# Patient Record
Sex: Female | Born: 1955 | Race: Asian | Hispanic: No | Smoking: Never smoker
Health system: Southern US, Community
[De-identification: ages and names within clinical notes are randomized; demographics above are authoritative.]

## PROBLEM LIST (undated history)

## (undated) DIAGNOSIS — E785 Hyperlipidemia, unspecified: Secondary | ICD-10-CM

## (undated) DIAGNOSIS — R911 Solitary pulmonary nodule: Secondary | ICD-10-CM

## (undated) DIAGNOSIS — M1990 Unspecified osteoarthritis, unspecified site: Secondary | ICD-10-CM

## (undated) DIAGNOSIS — I10 Essential (primary) hypertension: Secondary | ICD-10-CM

## (undated) DIAGNOSIS — R4189 Other symptoms and signs involving cognitive functions and awareness: Secondary | ICD-10-CM

## (undated) DIAGNOSIS — F329 Major depressive disorder, single episode, unspecified: Secondary | ICD-10-CM

## (undated) DIAGNOSIS — R569 Unspecified convulsions: Secondary | ICD-10-CM

## (undated) DIAGNOSIS — R413 Other amnesia: Secondary | ICD-10-CM

## (undated) DIAGNOSIS — C3490 Malignant neoplasm of unspecified part of unspecified bronchus or lung: Secondary | ICD-10-CM

## (undated) DIAGNOSIS — C50919 Malignant neoplasm of unspecified site of unspecified female breast: Secondary | ICD-10-CM

## (undated) DIAGNOSIS — F32A Depression, unspecified: Secondary | ICD-10-CM

## (undated) HISTORY — DX: Hyperlipidemia, unspecified: E78.5

## (undated) HISTORY — DX: Other symptoms and signs involving cognitive functions and awareness: R41.89

## (undated) HISTORY — DX: Depression, unspecified: F32.A

## (undated) HISTORY — DX: Other amnesia: R41.3

## (undated) HISTORY — PX: OTHER SURGICAL HISTORY: SHX169

## (undated) HISTORY — DX: Solitary pulmonary nodule: R91.1

## (undated) HISTORY — DX: Essential (primary) hypertension: I10

## (undated) HISTORY — DX: Major depressive disorder, single episode, unspecified: F32.9

## (undated) HISTORY — DX: Malignant neoplasm of unspecified site of unspecified female breast: C50.919

## (undated) HISTORY — DX: Unspecified convulsions: R56.9

## (undated) HISTORY — PX: BREAST BIOPSY: SHX20

## (undated) HISTORY — PX: MASTECTOMY: SHX3

## (undated) HISTORY — DX: Malignant neoplasm of unspecified part of unspecified bronchus or lung: C34.90

## (undated) HISTORY — PX: LUNG LOBECTOMY: SHX167

---

## 2004-12-14 ENCOUNTER — Ambulatory Visit: Payer: Self-pay

## 2005-09-03 ENCOUNTER — Ambulatory Visit: Payer: Self-pay

## 2005-09-14 ENCOUNTER — Ambulatory Visit: Payer: Self-pay

## 2006-02-28 ENCOUNTER — Ambulatory Visit: Payer: Self-pay

## 2006-11-23 ENCOUNTER — Emergency Department (HOSPITAL_COMMUNITY): Admission: EM | Admit: 2006-11-23 | Discharge: 2006-11-23 | Payer: Self-pay | Admitting: Emergency Medicine

## 2006-12-07 ENCOUNTER — Ambulatory Visit: Payer: Self-pay

## 2007-02-21 ENCOUNTER — Ambulatory Visit: Payer: Self-pay

## 2008-02-28 ENCOUNTER — Ambulatory Visit: Payer: Self-pay | Admitting: Internal Medicine

## 2009-03-06 ENCOUNTER — Ambulatory Visit: Payer: Self-pay | Admitting: Internal Medicine

## 2009-03-11 ENCOUNTER — Ambulatory Visit: Payer: Self-pay | Admitting: Internal Medicine

## 2009-03-20 ENCOUNTER — Ambulatory Visit: Payer: Self-pay | Admitting: Internal Medicine

## 2009-03-24 ENCOUNTER — Ambulatory Visit: Payer: Self-pay | Admitting: Internal Medicine

## 2010-03-05 ENCOUNTER — Ambulatory Visit: Payer: Self-pay | Admitting: Internal Medicine

## 2010-03-12 ENCOUNTER — Ambulatory Visit: Payer: Self-pay | Admitting: Internal Medicine

## 2011-03-23 ENCOUNTER — Ambulatory Visit: Payer: Self-pay | Admitting: Internal Medicine

## 2011-11-11 ENCOUNTER — Ambulatory Visit: Payer: Self-pay | Admitting: Internal Medicine

## 2012-03-15 ENCOUNTER — Ambulatory Visit: Payer: Self-pay | Admitting: Internal Medicine

## 2012-03-21 ENCOUNTER — Ambulatory Visit: Payer: Self-pay | Admitting: Internal Medicine

## 2012-03-28 ENCOUNTER — Ambulatory Visit: Payer: Self-pay | Admitting: Internal Medicine

## 2012-12-06 LAB — CREATININE, SERUM

## 2013-04-18 ENCOUNTER — Ambulatory Visit: Payer: Self-pay | Admitting: Internal Medicine

## 2013-11-20 ENCOUNTER — Ambulatory Visit: Payer: Self-pay | Admitting: Gastroenterology

## 2013-11-23 LAB — PATHOLOGY REPORT

## 2014-04-11 ENCOUNTER — Ambulatory Visit: Payer: Self-pay | Admitting: Internal Medicine

## 2014-04-18 ENCOUNTER — Ambulatory Visit: Payer: Self-pay | Admitting: Cardiothoracic Surgery

## 2014-04-24 ENCOUNTER — Ambulatory Visit: Payer: Self-pay | Admitting: Cardiothoracic Surgery

## 2014-04-25 LAB — CBC CANCER CENTER
BASOS ABS: 0 x10 3/mm (ref 0.0–0.1)
Basophil %: 0.7 %
EOS PCT: 0.5 %
Eosinophil #: 0 x10 3/mm (ref 0.0–0.7)
HCT: 40.4 % (ref 35.0–47.0)
HGB: 13.4 g/dL (ref 12.0–16.0)
Lymphocyte #: 1.1 x10 3/mm (ref 1.0–3.6)
Lymphocyte %: 31.9 %
MCH: 30.5 pg (ref 26.0–34.0)
MCHC: 33.2 g/dL (ref 32.0–36.0)
MCV: 92 fL (ref 80–100)
MONO ABS: 0.3 x10 3/mm (ref 0.2–0.9)
Monocyte %: 7.8 %
NEUTROS ABS: 2 x10 3/mm (ref 1.4–6.5)
NEUTROS PCT: 59.1 %
Platelet: 201 x10 3/mm (ref 150–440)
RBC: 4.4 10*6/uL (ref 3.80–5.20)
RDW: 13 % (ref 11.5–14.5)
WBC: 3.4 x10 3/mm — ABNORMAL LOW (ref 3.6–11.0)

## 2014-04-25 LAB — BASIC METABOLIC PANEL
ANION GAP: 5 — AB (ref 7–16)
BUN: 18 mg/dL (ref 7–18)
CREATININE: 0.86 mg/dL (ref 0.60–1.30)
Calcium, Total: 9.7 mg/dL (ref 8.5–10.1)
Chloride: 103 mmol/L (ref 98–107)
Co2: 33 mmol/L — ABNORMAL HIGH (ref 21–32)
EGFR (African American): >60
EGFR (Non-African Amer.): >60
GLUCOSE: 117 mg/dL — AB (ref 65–99)
Osmolality: 284 (ref 275–301)
Potassium: 4.4 mmol/L (ref 3.5–5.1)
Sodium: 141 mmol/L (ref 136–145)

## 2014-04-25 LAB — CREATININE, SERUM: Creatine, Serum: 0.61

## 2014-04-25 LAB — APTT: ACTIVATED PTT: 27.3 s (ref 23.6–35.9)

## 2014-04-25 LAB — PROTIME-INR
INR: 1
PROTHROMBIN TIME: 13.3 s (ref 11.5–14.7)

## 2014-04-29 ENCOUNTER — Ambulatory Visit: Payer: Self-pay

## 2014-05-01 ENCOUNTER — Inpatient Hospital Stay: Payer: Self-pay

## 2014-05-02 LAB — BASIC METABOLIC PANEL
ANION GAP: 8 (ref 7–16)
BUN: 15 mg/dL (ref 7–18)
CALCIUM: 7.9 mg/dL — AB (ref 8.5–10.1)
Chloride: 104 mmol/L (ref 98–107)
Co2: 27 mmol/L (ref 21–32)
Creatinine: 0.68 mg/dL (ref 0.60–1.30)
EGFR (African American): >60
Glucose: 123 mg/dL — ABNORMAL HIGH (ref 65–99)
Osmolality: 280 (ref 275–301)
Potassium: 3.8 mmol/L (ref 3.5–5.1)
SODIUM: 139 mmol/L (ref 136–145)

## 2014-05-02 LAB — CBC WITH DIFFERENTIAL/PLATELET
Basophil #: 0 10*3/uL (ref 0.0–0.1)
Basophil %: 0.2 %
Eosinophil #: 0 10*3/uL (ref 0.0–0.7)
Eosinophil %: 0.1 %
HCT: 35.6 % (ref 35.0–47.0)
HGB: 11.7 g/dL — ABNORMAL LOW (ref 12.0–16.0)
Lymphocyte #: 1.4 10*3/uL (ref 1.0–3.6)
Lymphocyte %: 15.1 %
MCH: 30.5 pg (ref 26.0–34.0)
MCHC: 32.8 g/dL (ref 32.0–36.0)
MCV: 93 fL (ref 80–100)
MONO ABS: 1 x10 3/mm — AB (ref 0.2–0.9)
Monocyte %: 11.2 %
NEUTROS ABS: 6.6 10*3/uL — AB (ref 1.4–6.5)
NEUTROS PCT: 73.4 %
RBC: 3.83 10*6/uL (ref 3.80–5.20)
RDW: 13.1 % (ref 11.5–14.5)
WBC: 9 10*3/uL (ref 3.6–11.0)

## 2014-05-03 LAB — COMPREHENSIVE METABOLIC PANEL
ALBUMIN: 2.8 g/dL — AB (ref 3.4–5.0)
ANION GAP: 6 — AB (ref 7–16)
AST: 27 U/L (ref 15–37)
Alkaline Phosphatase: 46 U/L
BUN: 8 mg/dL (ref 7–18)
Bilirubin,Total: 0.6 mg/dL (ref 0.2–1.0)
CREATININE: 0.61 mg/dL (ref 0.60–1.30)
Calcium, Total: 8 mg/dL — ABNORMAL LOW (ref 8.5–10.1)
Chloride: 102 mmol/L (ref 98–107)
Co2: 32 mmol/L (ref 21–32)
EGFR (African American): >60
EGFR (Non-African Amer.): >60
Glucose: 122 mg/dL — ABNORMAL HIGH (ref 65–99)
OSMOLALITY: 279 (ref 275–301)
Potassium: 3.6 mmol/L (ref 3.5–5.1)
SGPT (ALT): 19 U/L
Sodium: 140 mmol/L (ref 136–145)
Total Protein: 6 g/dL — ABNORMAL LOW (ref 6.4–8.2)

## 2014-05-03 LAB — CBC WITH DIFFERENTIAL/PLATELET
Basophil #: 0 10*3/uL (ref 0.0–0.1)
Basophil %: 0.2 %
EOS ABS: 0 10*3/uL (ref 0.0–0.7)
Eosinophil %: 0.2 %
HCT: 35.2 % (ref 35.0–47.0)
HGB: 11.7 g/dL — ABNORMAL LOW (ref 12.0–16.0)
Lymphocyte #: 1.2 10*3/uL (ref 1.0–3.6)
Lymphocyte %: 13.8 %
MCH: 30.4 pg (ref 26.0–34.0)
MCHC: 33.3 g/dL (ref 32.0–36.0)
MCV: 91 fL (ref 80–100)
Monocyte #: 0.9 x10 3/mm (ref 0.2–0.9)
Monocyte %: 10.2 %
NEUTROS PCT: 75.6 %
Neutrophil #: 6.5 10*3/uL (ref 1.4–6.5)
Platelet: 152 10*3/uL (ref 150–440)
RBC: 3.85 10*6/uL (ref 3.80–5.20)
RDW: 13.1 % (ref 11.5–14.5)
WBC: 8.5 10*3/uL (ref 3.6–11.0)

## 2014-05-12 ENCOUNTER — Ambulatory Visit: Payer: Self-pay | Admitting: Cardiothoracic Surgery

## 2014-05-23 ENCOUNTER — Ambulatory Visit: Payer: Self-pay | Admitting: Internal Medicine

## 2014-05-27 DIAGNOSIS — C3411 Malignant neoplasm of upper lobe, right bronchus or lung: Secondary | ICD-10-CM | POA: Insufficient documentation

## 2014-05-31 ENCOUNTER — Ambulatory Visit: Payer: Self-pay | Admitting: Internal Medicine

## 2014-06-13 ENCOUNTER — Ambulatory Visit: Payer: Self-pay | Admitting: Cardiothoracic Surgery

## 2014-07-12 ENCOUNTER — Ambulatory Visit: Payer: Self-pay | Admitting: Cardiothoracic Surgery

## 2014-08-15 ENCOUNTER — Ambulatory Visit: Payer: Self-pay | Admitting: Cardiothoracic Surgery

## 2014-09-10 ENCOUNTER — Ambulatory Visit
Admit: 2014-09-10 | Disposition: A | Discharge status: Home / Self Care | Payer: Self-pay | Attending: Cardiothoracic Surgery | Admitting: Cardiothoracic Surgery

## 2014-10-25 ENCOUNTER — Other Ambulatory Visit: Payer: Self-pay | Admitting: Oncology

## 2014-10-25 DIAGNOSIS — C3490 Malignant neoplasm of unspecified part of unspecified bronchus or lung: Secondary | ICD-10-CM

## 2014-11-02 NOTE — Discharge Summary (Signed)
PATIENT NAME:  Margaret Potts, Margaret Potts MR#:  [id number] DATE OF BIRTH:  [birth year]  DATE OF ADMISSION:  05/01/2014 DATE OF DISCHARGE:  05/08/2014  ADMITTING DIAGNOSIS: Right upper lobe mass.   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   OPERATION PERFORMED: Right upper lobectomy.   HOSPITAL COURSE: [name] is a 59 year old Asian female who had a right upper lobe mass identified on a CT scan. The patient underwent a complete evaluation and was found to have a spiculated mass in the right upper lobe consistent with a primary lung cancer. She was taken to the operating room on the 21st of October where she underwent a preoperative bronchoscopy and right thoracotomy with right upper lobectomy. The intraoperative findings were that of a primary lung malignancy. She was nursed overnight in the intensive care unit and then discharged to the floor where she recovered over the next several days. Her chest tubes were removed on postoperative day #6 and she was discharged to home on postoperative day #7. At the time of discharge she was afebrile. Her oxygen saturations were in the high 90s on room air. Her wounds were healing as expected.   She was given instructions to follow up with Dr. Genevive Bi the next day in clinic for a wound check and for a chest x-ray. She also will follow up with Dr. Grayland Ormond and Dr. Doy Hutching in 2 weeks. She was given a prescription for Norco 5/325 one to two tablets every 4-6 hours as needed as well as losartan 50 mg a day.    ____________________________ Lew Dawes. Genevive Bi, MD teo:TT D: 05/08/2014 13:57:20 ET T: 05/08/2014 21:34:50 ET JOB#: [id number]  cc: Christia Reading E. Genevive Bi, MD, <Dictator> Louis Matte MD ELECTRONICALLY SIGNED 05/22/2014 9:26

## 2014-11-02 NOTE — Consult Note (Signed)
Brief Consult Note: Diagnosis: medical management.   Patient was seen by consultant.   Consult note dictated.   Orders entered.   Comments: will be happy to keep managing medical issues.  Electronic Signatures: Vaughan Basta (MD)  (Signed 21-Oct-15 17:23)  Authored: Brief Consult Note   Last Updated: 21-Oct-15 17:23 by Vaughan Basta (MD)

## 2014-11-02 NOTE — Consult Note (Signed)
PATIENT NAME:  Margaret Potts, Margaret Potts MR#:  [id number] DATE OF BIRTH:  [birth year]  DATE OF CONSULTATION:  05/01/2014  REFERRING PHYSICIAN:   CONSULTING PHYSICIAN:  Ceasar Lund. Anselm Jungling, MD  REFERRING PHYSICIAN: Dr. Nestor Lewandowsky.    REASON FOR CONSULTATION: Medical management postoperatively.   PRIMARY CARE PHYSICIAN: Dr. Fulton Reek.   HISTORY OF PRESENT ILLNESS: This is a 59 year old female who was recently diagnosed with right upper lobe nodule in the lung which was increasing in size and was worrisome for primary bronchogenic carcinoma. A PET scan was done on October 14 which showed hypermetabolic right upper lobe pulmonary nodule with no evidence of mediastinal node metastasis or distant metastasis. She was referred to oncology clinic and they plan to have resection of the primary tumor as there was no metastasis noted.  Dr. Nestor Lewandowsky took her for the surgery today and did right upper lobe resection and postsurgically he called the medical team to manage her medical issues if there is anything that arises. On further questioning the patient denies any complaint of pain, cough or nausea, vomiting, or shortness of breath right now and resting comfortably.   REVIEW OF SYSTEMS: CONSTITUTIONAL: Negative for fever, fatigue, weakness, pain, or weight loss.  EYES: No blurring or double vision, discharge, or redness.  EARS, NOSE, THROAT: No tinnitus, ear pain, or hearing loss.  RESPIRATORY: No cough, wheezing, hemoptysis, or shortness of breath.  CARDIOVASCULAR: No chest pain, orthopnea, edema, arrhythmia, palpitation.  GASTROINTESTINAL: No nausea, vomiting, diarrhea, abdominal pain.  GENITOURINARY: No dysuria, hematuria, increased frequency.  ENDOCRINE: No heat or cold intolerance. No excessive sweating.  SKIN: No acne, rashes, or lesions.  MUSCULOSKELETAL: No pain or swelling in the joints.  NEUROLOGICAL: No numbness, weakness, tremor, or vertigo.  PSYCHIATRIC: No anxiety, insomnia, bipolar  disorder.   PAST MEDICAL HISTORY: The patient had remote history of hypertension and hyperlipidemia and right-sided mastectomy for breast cancer in 1995, but she says she is following with Dr. Doy Hutching regularly and she was on medication in the past for blood pressure and hyperlipidemia, but was taken off as it was remaining normal and on regular checkups it stayed normal.   SOCIAL HISTORY: She denies smoking, alcohol, or illegal drug use and working in post office.   FAMILY HISTORY: Mother with lung and liver cancer, nonsmoker, died in her 16s. Brother with lung and liver cancer and positive smoker. Sister with lung and liver cancer, nonsmoker and recently died on and paternal aunt with breast cancer at early age. Her last colonoscopy was May 2015.   MEDICATIONS:  1.  Vitamin D3 5000 international units once a day.  2.  Vitamin C 500 mg 2 tablets once a day.  3.  Magnesium oxide 400 mg once a day.  4.  Folic acid 1 mg once a day.   5.  Collagen once a day.  6.  Centrum Silver once a day.   PHYSICAL EXAMINATION:   VITAL SIGNS: Currently temperature 97.9, pulse is 66, respirations 12, blood pressure 104/57, and pulse oximetry is 100 on 2 liters oxygen supplementation.  GENERAL: The patient is fully alert and oriented to time, place, and person. Does not appear in acute distress.  HEENT: Head and neck atraumatic. Conjunctivae pink. Oral mucosa moist.  NECK: Supple. No JVD.  RESPIRATORY: Bilateral air entry present. There is dressing on the right side. No crepitation or wheezing.  CARDIOVASCULAR: S1, S2 present, regular. No murmur.  ABDOMEN: Soft, nontender. Bowel sounds present. No organomegaly.  SKIN: No rashes.  LEGS: No edema.  NEUROLOGICAL: Power 5 out of 5. No tremor or rigidity. Follows commands.  JOINTS: No swelling or tenderness.  PSYCHIATRIC: Does not appear in acute psychiatric illness at this time.   IMPORTANT LABORATORY RESULTS:   1.  Glucose 128, BUN 18, creatinine 0.86,  sodium 141, potassium 4.4, chloride 103, CO2 of 33, calcium is 9.7.  2.  WBC 3.4, hemoglobin 13.4, platelet count 201,000,  MCV is 92.   3.  Chest CT and PET scan as mentioned above.   ASSESSMENT AND PLAN: A 60 year old female who is currently postoperative for right upper lobe lung nodule resection and medical consult is called in for general medical management.   1.  Lung nodule status post resection. Further management as per the oncological team, currently no intervention is required, wait for the pathological report.  2.  Pain.  Post surgically there would be pain and we will manage it with Tylenol and morphine as needed.  3.  Hyperglycemia. Blood sugar level is 128, but that does not appear to be. she will just continue monitoring and may not require anything for long term.  4.  Code status is full code.   TOTAL TIME SPENT ON THIS CONSULT: 45 minutes.    We are thankful for letting us participate in the care of this patient. We will continue following for any medical issues.      ____________________________ Ceasar Lund Anselm Jungling, MD vgv:bu D: 05/01/2014 17:35:24 ET T: 05/01/2014 20:53:16 ET JOB#: [id number]  cc: Ceasar Lund. Anselm Jungling, MD, <Dictator> Vaughan Basta MD ELECTRONICALLY SIGNED 05/22/2014 22:13

## 2014-11-02 NOTE — Op Note (Signed)
PATIENT NAME:  Margaret Potts, Margaret Potts MR#:  [id number] DATE OF BIRTH:  August 30, 1955  DATE OF PROCEDURE:  05/01/2014  SURGEON:  Christia Reading E. Genevive Bi, MD   ASSISTANT:  Dr. Bronson Ing  PREOPERATIVE DIAGNOSIS:  Right upper lobe mass.   POSTOPERATIVE DIAGNOSIS:  Right upper lobe mass.  OPERATION PERFORMED: 1.  Preoperative bronchoscopy to assess endobronchial anatomy.  2.  Right muscle-sparing thoracotomy with right upper lobectomy. Frozen section consistent with adenocarcinoma.   INDICATIONS FOR PROCEDURE:  Margaret Potts is a 59 year old Asian female with a right upper lobe mass identified on chest CT and on PET. It was highly suspicious for malignancy. In addition, she has previously undergone a right mastectomy many years ago for breast cancer, but this had more of the appearance of lung cancer, and because of that, she was offered the above-named operation. The indications and risks were explained to the patient, who gave her informed consent.   DESCRIPTION OF PROCEDURE:  The patient was brought to the operating suite and placed in the supine position. General endotracheal anesthesia was given with a double-lumen tube. Preoperative bronchoscopy was carried out. This was normal to the subsegmental levels bilaterally. There was no evidence of endobronchial tumor. The patient was then turned for a right thoracotomy. All pressure points were carefully padded. The patient was prepped and draped in the usual sterile fashion. A muscle-sparing incision was made, and the fifth intercostal space was entered. The latissimus and serratus muscles were retracted anteriorly and posteriorly, and a small rib retractor was placed. Through this incision, we then visualized the diaphragm and created an access port on the inferior aspect of the right chest. This was for camera and for other stapling devices. The fissures were near complete, and we began by opening the pleura overlying the hilum of the lung. The superior pulmonary vein was  identified, and the branches of the middle lobe and upper lobe were identified as well. The fissure was opened, and the fissure between the upper and the middle lobe was created using a stapler. The bronchus was identified posteriorly, and further dissection within the fissure revealed multiple branches to the upper lobes. There was a large posterior ascending branch and then there were 2 other smaller branches as well as the apical anterior branch; all of these went to the upper lobe. We divided these using the vascular stapler. The superior pulmonary vein draining the upper lobe was also divided with a vascular stapler. The middle lobe veins and arteries were spared, of course. The only remaining structure was the bronchus, and using a TX stapler, we occluded the bronchus. The middle and lower lobes ventilated nicely, and then the stapler was fired, and the specimen was transected and passed off the field. The bronchial stump was checked at 30 cm of water pressure, and there was no air leak. The chest was copiously irrigated, and 2 chest tubes were inserted. An angled and a straight tube were placed through their normal positions. The previous thoracoscopy port site was closed with interrupted Vicryls and nylon on the skin. The middle lobe was then stapled to the lower lobe to prevent torsion, as it was a near complete fissure. The inferior pulmonary ligament was likewise divided up to the level of the inferior pulmonary vein to allow mobilization of the entire lung. At this point, hemostasis was complete, and we then placed our paracostal sutures of #2 Vicryl. The ribs were brought back into place, and the muscles of the chest wall were allowed to return  to their normal anatomic position. A 15 mm Blake drain was placed in the subcutaneous tissues, and the subcutaneous tissues were closed with a 2-0 Vicryl. The skin was closed with staples. Sterile dressings were applied, and then the patient was rolled in the  supine position, where she was extubated and taken to the recovery room in stable condition.    ____________________________ Lew Dawes Genevive Bi, MD teo:nb D: 05/01/2014 15:18:33 ET T: 05/02/2014 06:22:36 ET JOB#: [id number]  cc: Lew Dawes. Genevive Bi, MD, <Dictator> Leonie Douglas. Doy Hutching, MD Kathlene November. Grayland Ormond, [place] MD ELECTRONICALLY SIGNED 05/22/2014 9:26

## 2014-11-04 LAB — SURGICAL PATHOLOGY

## 2014-11-06 DIAGNOSIS — R413 Other amnesia: Secondary | ICD-10-CM | POA: Insufficient documentation

## 2014-11-06 DIAGNOSIS — R911 Solitary pulmonary nodule: Secondary | ICD-10-CM | POA: Insufficient documentation

## 2014-11-06 DIAGNOSIS — R4189 Other symptoms and signs involving cognitive functions and awareness: Secondary | ICD-10-CM | POA: Insufficient documentation

## 2014-11-06 DIAGNOSIS — E785 Hyperlipidemia, unspecified: Secondary | ICD-10-CM | POA: Insufficient documentation

## 2014-11-14 ENCOUNTER — Ambulatory Visit: Payer: Self-pay | Admitting: Oncology

## 2014-11-14 ENCOUNTER — Ambulatory Visit: Admission: RE | Admit: 2014-11-14 | Payer: Federal, State, Local not specified - PPO | Source: Ambulatory Visit

## 2014-11-26 ENCOUNTER — Other Ambulatory Visit: Payer: Self-pay | Admitting: Oncology

## 2014-11-26 DIAGNOSIS — R911 Solitary pulmonary nodule: Secondary | ICD-10-CM

## 2014-11-27 ENCOUNTER — Other Ambulatory Visit: Payer: Self-pay | Admitting: Oncology

## 2014-11-27 ENCOUNTER — Ambulatory Visit
Admission: RE | Admit: 2014-11-27 | Discharge: 2014-11-27 | Disposition: A | Discharge status: Home / Self Care | Payer: Federal, State, Local not specified - PPO | Source: Ambulatory Visit | Attending: Oncology | Admitting: Oncology

## 2014-11-27 ENCOUNTER — Ambulatory Visit: Payer: Federal, State, Local not specified - PPO | Admitting: Oncology

## 2014-11-27 DIAGNOSIS — R911 Solitary pulmonary nodule: Secondary | ICD-10-CM

## 2014-11-27 DIAGNOSIS — Z9011 Acquired absence of right breast and nipple: Secondary | ICD-10-CM | POA: Diagnosis not present

## 2014-11-27 DIAGNOSIS — I517 Cardiomegaly: Secondary | ICD-10-CM | POA: Diagnosis not present

## 2014-11-27 DIAGNOSIS — C3490 Malignant neoplasm of unspecified part of unspecified bronchus or lung: Secondary | ICD-10-CM

## 2014-11-27 MED ORDER — IOHEXOL 300 MG/ML  SOLN
75.0000 mL | Freq: Once | INTRAMUSCULAR | Status: AC | PRN
  Administered 2014-11-27: 75 mL via INTRAVENOUS

## 2014-12-02 ENCOUNTER — Inpatient Hospital Stay: Payer: Federal, State, Local not specified - PPO | Attending: Oncology | Admitting: Oncology

## 2014-12-02 ENCOUNTER — Encounter: Payer: Self-pay | Admitting: Oncology

## 2014-12-02 ENCOUNTER — Encounter (INDEPENDENT_AMBULATORY_CARE_PROVIDER_SITE_OTHER): Payer: Self-pay

## 2014-12-02 VITALS — BP 131/86 | HR 59 | Temp 98.2°F | Ht 60.0 in | Wt 114.3 lb

## 2014-12-02 DIAGNOSIS — Z79899 Other long term (current) drug therapy: Secondary | ICD-10-CM | POA: Diagnosis not present

## 2014-12-02 DIAGNOSIS — Z9011 Acquired absence of right breast and nipple: Secondary | ICD-10-CM | POA: Diagnosis not present

## 2014-12-02 DIAGNOSIS — Z902 Acquired absence of lung [part of]: Secondary | ICD-10-CM | POA: Insufficient documentation

## 2014-12-02 DIAGNOSIS — I10 Essential (primary) hypertension: Secondary | ICD-10-CM | POA: Insufficient documentation

## 2014-12-02 DIAGNOSIS — Z853 Personal history of malignant neoplasm of breast: Secondary | ICD-10-CM | POA: Insufficient documentation

## 2014-12-02 DIAGNOSIS — C3411 Malignant neoplasm of upper lobe, right bronchus or lung: Secondary | ICD-10-CM | POA: Insufficient documentation

## 2014-12-02 DIAGNOSIS — Z9221 Personal history of antineoplastic chemotherapy: Secondary | ICD-10-CM | POA: Diagnosis not present

## 2014-12-19 NOTE — Progress Notes (Signed)
[place]  Telephone:([phone] Fax:(336) [phone]  ID: Margaret Potts OB: [birth year]  MR#: [id number]  BTD#:[id number]  Patient Care Team: Idelle Crouch, MD as PCP - General (Internal Medicine)  CHIEF COMPLAINT:  Chief Complaint  Patient presents with  . Follow-up    CT scan for hx of breast CA and lung CA    INTERVAL HISTORY: Patient returns to clinic today for further evaluation and discussion of her imaging results. She continues to feel well and is remains asymptomatic. She has no neurologic complaints. She denies any recent fevers or illnesses. She has a good appetite and denies weight loss. She has no chest pain, shortness breath, cough, or hemoptysis. She denies any nausea, vomiting, constipation, or diarrhea. Has no urinary complaints. Patient offers no specific complaints today.  REVIEW OF SYSTEMS:   Review of Systems  Constitutional: Negative.   Respiratory: Negative.   Cardiovascular: Negative.     As per HPI. Otherwise, a complete review of systems is negatve.  PAST MEDICAL HISTORY: Past Medical History  Diagnosis Date  . Hyperlipemia   . Hypertension   . Cognitive deficits   . Memory loss   . Seizures   . Depression   . Lung nodule   . Breast cancer     RT with mastectomy in 1995  . Lung cancer     RT upper lobe removed in OCT 2015    PAST SURGICAL HISTORY: Past Surgical History  Procedure Laterality Date  . Right mastectomy      FAMILY HISTORY: Mother with lung/liver cancer nonsmoker died in her 41s. Brother with lung/liver cancer, positive smoker. Sister, lung/liver cancer nonsmoker recently deceased. Paternal aunt with breast cancer at an "early age"     ADVANCED DIRECTIVES:    HEALTH MAINTENANCE: History  Substance Use Topics  . Smoking status: Never Smoker   . Smokeless tobacco: Never Used  . Alcohol Use: Yes     Comment: occassionally     Colonoscopy:  PAP:  Bone density:  Lipid panel:  No Known  Allergies  No current outpatient prescriptions on file.   No current facility-administered medications for this visit.    OBJECTIVE: Filed Vitals:   12/02/14 1547  BP: 131/86  Pulse: 59  Temp: 98.2 F (36.8 C)     Body mass index is 22.32 kg/(m^2).    ECOG FS:0 - Asymptomatic  General: Well-developed, well-nourished, no acute distress. Eyes: anicteric sclera. Lungs: Clear to auscultation bilaterally. Heart: Regular rate and rhythm. No rubs, murmurs, or gallops. Abdomen: Soft, nontender, nondistended. No organomegaly noted, normoactive bowel sounds. Musculoskeletal: No edema, cyanosis, or clubbing. Neuro: Alert, answering all questions appropriately. Cranial nerves grossly intact. Skin: No rashes or petechiae noted. Psych: Normal affect.   LAB RESULTS:  Lab Results  Component Value Date   NA 140 05/03/2014   K 3.6 05/03/2014   CL 102 05/03/2014   CO2 32 05/03/2014   GLUCOSE 122* 05/03/2014   BUN 8 05/03/2014   CREATININE 0.61 05/03/2014   CALCIUM 8.0* 05/03/2014   PROT 6.0* 05/03/2014   ALBUMIN 2.8* 05/03/2014   AST 27 05/03/2014   ALT 19 05/03/2014   ALKPHOS 46 05/03/2014    Lab Results  Component Value Date   WBC 8.5 05/03/2014   NEUTROABS 6.5 05/03/2014   HGB 11.7* 05/03/2014   HCT 35.2 05/03/2014   MCV 91 05/03/2014   PLT 152 05/03/2014     STUDIES: Ct Chest W Contrast  11/27/2014   CLINICAL DATA:  Right  thoracic pain since prior right upper lobectomy in October 2015.  EXAM: CT CHEST WITH CONTRAST  TECHNIQUE: Multidetector CT imaging of the chest was performed during intravenous contrast administration.  CONTRAST:  28m OMNIPAQUE IOHEXOL 300 MG/ML  SOLN  COMPARISON:  05/31/2014  FINDINGS: Mediastinum/Nodes: Ascending aortic caliber is 3.6 cm. No pathologic adenopathy. Mild cardiomegaly.  Lungs/Pleura: Right mastectomy. Right upper lobectomy. Slightly reduced scarring superiorly along the right middle lobe. No pleural effusion or recurrent mass identified.  Mild scarring in the left lower lobe along the hemidiaphragm.  Upper abdomen: Unremarkable  Musculoskeletal: No significant rib lesion.  IMPRESSION: 1. A specific cause for the patient's right chest pain is not identified. The patient has had prior right mastectomy and right upper lobectomy. Expected postoperative findings noted. No recurrent mass. 2. Mild cardiomegaly. 3. Ascending aortic diameter 3.6 cm. Recommend annual imaging followup by CTA or MRA. This recommendation follows 2010 ACCF/AHA/AATS/ACR/ASA/SCA/SCAI/SIR/STS/SVM Guidelines for the Diagnosis and Management of Patients with Thoracic Aortic Disease. Circulation.2010; 121:: M767-M094  Electronically Signed   By: WVan ClinesM.D.   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  PLAN:    1. Lung cancer: Given patient's stage of disease she does not require any further adjuvant chemotherapy. Because she is a nonsmoker, can consider sending pathology for EGFR testing for future use if necessary. No further intervention is needed at this time. Return to clinic in 6 months with restaging CT scan and further evaluation. Will repeat imaging every 6 months for 2 years after her surgery and then yearly after that.  2. History of breast cancer: Patient had breast cancer in 1995. Genetic testing did not reveal any significant abnormalities. No further intervention is needed at this time.   Patient expressed understanding and was in agreement with this plan. She also understands that She can call clinic at any time with any questions, concerns, or complaints.   No matching staging information was found for the patient.  TLloyd Huger MD   12/19/2014 8:52 AM

## 2015-03-05 ENCOUNTER — Other Ambulatory Visit: Payer: Self-pay | Admitting: Internal Medicine

## 2015-03-05 DIAGNOSIS — R102 Pelvic and perineal pain: Secondary | ICD-10-CM

## 2015-03-05 DIAGNOSIS — N644 Mastodynia: Secondary | ICD-10-CM

## 2015-03-05 DIAGNOSIS — R079 Chest pain, unspecified: Secondary | ICD-10-CM

## 2015-03-05 DIAGNOSIS — C3411 Malignant neoplasm of upper lobe, right bronchus or lung: Secondary | ICD-10-CM

## 2015-03-05 DIAGNOSIS — R1084 Generalized abdominal pain: Secondary | ICD-10-CM

## 2015-03-11 ENCOUNTER — Ambulatory Visit
Admission: RE | Admit: 2015-03-11 | Discharge: 2015-03-11 | Disposition: A | Discharge status: Home / Self Care | Payer: Federal, State, Local not specified - PPO | Source: Ambulatory Visit | Attending: Internal Medicine | Admitting: Internal Medicine

## 2015-03-11 ENCOUNTER — Other Ambulatory Visit: Payer: Self-pay | Admitting: Internal Medicine

## 2015-03-11 DIAGNOSIS — D644 Congenital dyserythropoietic anemia: Secondary | ICD-10-CM | POA: Diagnosis present

## 2015-03-11 DIAGNOSIS — Z853 Personal history of malignant neoplasm of breast: Secondary | ICD-10-CM | POA: Insufficient documentation

## 2015-03-11 DIAGNOSIS — N644 Mastodynia: Secondary | ICD-10-CM

## 2015-03-12 ENCOUNTER — Ambulatory Visit
Admission: RE | Admit: 2015-03-12 | Discharge: 2015-03-12 | Disposition: A | Discharge status: Home / Self Care | Payer: Federal, State, Local not specified - PPO | Source: Ambulatory Visit | Attending: Internal Medicine | Admitting: Internal Medicine

## 2015-03-12 DIAGNOSIS — Z9011 Acquired absence of right breast and nipple: Secondary | ICD-10-CM | POA: Insufficient documentation

## 2015-03-12 DIAGNOSIS — Z85118 Personal history of other malignant neoplasm of bronchus and lung: Secondary | ICD-10-CM | POA: Diagnosis present

## 2015-03-12 DIAGNOSIS — R102 Pelvic and perineal pain: Secondary | ICD-10-CM

## 2015-03-12 DIAGNOSIS — J9811 Atelectasis: Secondary | ICD-10-CM | POA: Insufficient documentation

## 2015-03-12 DIAGNOSIS — R079 Chest pain, unspecified: Secondary | ICD-10-CM | POA: Diagnosis present

## 2015-03-12 DIAGNOSIS — C3411 Malignant neoplasm of upper lobe, right bronchus or lung: Secondary | ICD-10-CM

## 2015-03-12 DIAGNOSIS — R1084 Generalized abdominal pain: Secondary | ICD-10-CM

## 2015-03-12 MED ORDER — IOHEXOL 300 MG/ML  SOLN
100.0000 mL | Freq: Once | INTRAMUSCULAR | Status: AC | PRN
  Administered 2015-03-12: 80 mL via INTRAVENOUS

## 2015-06-09 ENCOUNTER — Ambulatory Visit
Admission: RE | Admit: 2015-06-09 | Discharge: 2015-06-09 | Disposition: A | Discharge status: Home / Self Care | Payer: Federal, State, Local not specified - PPO | Source: Ambulatory Visit | Attending: Oncology | Admitting: Oncology

## 2015-06-09 DIAGNOSIS — Z902 Acquired absence of lung [part of]: Secondary | ICD-10-CM | POA: Diagnosis not present

## 2015-06-09 DIAGNOSIS — C3411 Malignant neoplasm of upper lobe, right bronchus or lung: Secondary | ICD-10-CM | POA: Insufficient documentation

## 2015-06-09 LAB — POCT I-STAT CREATININE: CREATININE: 0.9 mg/dL (ref 0.44–1.00)

## 2015-06-09 MED ORDER — IOHEXOL 350 MG/ML SOLN
100.0000 mL | Freq: Once | INTRAVENOUS | Status: AC | PRN
  Administered 2015-06-09: 65 mL via INTRAVENOUS

## 2015-06-11 ENCOUNTER — Inpatient Hospital Stay: Payer: Federal, State, Local not specified - PPO | Attending: Oncology | Admitting: Oncology

## 2015-06-11 VITALS — BP 135/84 | HR 58 | Temp 98.5°F | Resp 16 | Wt 114.2 lb

## 2015-06-11 DIAGNOSIS — C3411 Malignant neoplasm of upper lobe, right bronchus or lung: Secondary | ICD-10-CM | POA: Insufficient documentation

## 2015-06-11 DIAGNOSIS — I10 Essential (primary) hypertension: Secondary | ICD-10-CM | POA: Insufficient documentation

## 2015-06-11 DIAGNOSIS — Z902 Acquired absence of lung [part of]: Secondary | ICD-10-CM | POA: Diagnosis not present

## 2015-06-11 DIAGNOSIS — F329 Major depressive disorder, single episode, unspecified: Secondary | ICD-10-CM | POA: Insufficient documentation

## 2015-06-11 DIAGNOSIS — Z9011 Acquired absence of right breast and nipple: Secondary | ICD-10-CM | POA: Diagnosis not present

## 2015-06-11 DIAGNOSIS — Z853 Personal history of malignant neoplasm of breast: Secondary | ICD-10-CM | POA: Diagnosis not present

## 2015-06-11 DIAGNOSIS — E785 Hyperlipidemia, unspecified: Secondary | ICD-10-CM | POA: Diagnosis not present

## 2015-06-11 DIAGNOSIS — C3401 Malignant neoplasm of right main bronchus: Secondary | ICD-10-CM

## 2015-06-11 DIAGNOSIS — Z9889 Other specified postprocedural states: Secondary | ICD-10-CM | POA: Insufficient documentation

## 2015-06-11 NOTE — Progress Notes (Signed)
Patient had a cold 3 weeks ago and has had a dry cough since.

## 2015-06-20 NOTE — Progress Notes (Signed)
[place]  [phone] Fax:(336) [phone]  ID: Farrel Gobble OB: [birth year]  MR#: [id number]  TDV#:[id number]  Patient Care Team: Idelle Crouch, MD as PCP - General (Internal Medicine)  CHIEF COMPLAINT:  Chief Complaint  Patient presents with  . Lung Cancer    INTERVAL HISTORY: Patient returns to clinic today for further evaluation and discussion of her imaging results. Patient had increased cough and congestion approximately 3 months ago, but this has resolved. She currently feels well and is asymptomatic. She has no neurologic complaints. She any recent fevers. She has a good appetite and denies weight loss. She has no chest pain, shortness breath, or hemoptysis. She denies any nausea, vomiting, constipation, or diarrhea. Has no urinary complaints. Patient offers no further specific complaints today.  REVIEW OF SYSTEMS:   Review of Systems  Constitutional: Negative.  Negative for fever, weight loss and malaise/fatigue.  Respiratory: Negative.   Cardiovascular: Negative.   Gastrointestinal: Negative.   Musculoskeletal: Negative.   Neurological: Negative.  Negative for weakness.    As per HPI. Otherwise, a complete review of systems is negatve.  PAST MEDICAL HISTORY: Past Medical History  Diagnosis Date  . Hyperlipemia   . Hypertension   . Cognitive deficits   . Memory loss   . Seizures ([place])   . Depression   . Lung nodule   . Lung cancer ([place])     RT upper lobe removed in OCT 2015  . Breast cancer ([place])     RT with mastectomy in 1995    PAST SURGICAL HISTORY: Past Surgical History  Procedure Laterality Date  . Right mastectomy    . Mastectomy Right   . Breast biopsy Left     benign    FAMILY HISTORY: Mother with lung/liver cancer nonsmoker died in her 44s. Brother with lung/liver cancer, positive smoker. Sister, lung/liver cancer nonsmoker recently deceased. Paternal aunt with breast cancer at an "early age"     ADVANCED  DIRECTIVES:    HEALTH MAINTENANCE: Social History  Substance Use Topics  . Smoking status: Never Smoker   . Smokeless tobacco: Never Used  . Alcohol Use: Yes     Comment: occassionally     Colonoscopy:  PAP:  Bone density:  Lipid panel:  No Known Allergies  No current outpatient prescriptions on file.   No current facility-administered medications for this visit.    OBJECTIVE: Filed Vitals:   06/11/15 1520  BP: 135/84  Pulse: 58  Temp: 98.5 F (36.9 C)  Resp: 16     Body mass index is 22.3 kg/(m^2).    ECOG FS:0 - Asymptomatic  General: Well-developed, well-nourished, no acute distress. Eyes: anicteric sclera. Lungs: Clear to auscultation bilaterally. Heart: Regular rate and rhythm. No rubs, murmurs, or gallops. Abdomen: Soft, nontender, nondistended. No organomegaly noted, normoactive bowel sounds. Musculoskeletal: No edema, cyanosis, or clubbing. Neuro: Alert, answering all questions appropriately. Cranial nerves grossly intact. Skin: No rashes or petechiae noted. Psych: Normal affect.   LAB RESULTS:  Lab Results  Component Value Date   NA 140 05/03/2014   K 3.6 05/03/2014   CL 102 05/03/2014   CO2 32 05/03/2014   GLUCOSE 122* 05/03/2014   BUN 8 05/03/2014   CREATININE 0.90 06/09/2015   CALCIUM 8.0* 05/03/2014   PROT 6.0* 05/03/2014   ALBUMIN 2.8* 05/03/2014   AST 27 05/03/2014   ALT 19 05/03/2014   ALKPHOS 46 05/03/2014   BILITOT 0.6 05/03/2014   GFRNONAA >60 05/03/2014   GFRAA >60 05/03/2014  Lab Results  Component Value Date   WBC 8.5 05/03/2014   NEUTROABS 6.5 05/03/2014   HGB 11.7* 05/03/2014   HCT 35.2 05/03/2014   MCV 91 05/03/2014   PLT 152 05/03/2014     STUDIES: Ct Chest W Contrast  06/09/2015  CLINICAL DATA:  Status post right upper lobectomy for right upper lobe lung cancer diagnosed in October 2015, presenting for restaging. History of right mastectomy for right breast cancer in 1995. EXAM: CT CHEST WITH CONTRAST  TECHNIQUE: Multidetector CT imaging of the chest was performed during intravenous contrast administration. CONTRAST:  78m OMNIPAQUE IOHEXOL 350 MG/ML SOLN COMPARISON:  03/12/2015 chest CT. FINDINGS: Mediastinum/Nodes: Normal heart size. No pericardial fluid/thickening. Great vessels are normal in course and caliber. No central pulmonary emboli. Normal visualized thyroid. Normal esophagus. No pathologically enlarged axillary, mediastinal or hilar lymph nodes. Lungs/Pleura: No pneumothorax. No pleural effusion. Stable postsurgical changes status post right upper lobectomy, with minimal pleural-parenchymal scarring in the peripheral right middle lobe and superior segment right lower lobe. Anterior right middle lobe 2 mm solid pulmonary nodule (series 3/ image 24) is stable since 05/31/2014. Anterior right lower lobe 5 mm pulmonary nodule (3/35) is stable since 05/31/2014. No acute consolidative airspace disease, new significant pulmonary nodules or lung masses. Upper abdomen: Unremarkable. Musculoskeletal: No aggressive appearing focal osseous lesions. Mild degenerative changes in the thoracic spine. Stable postsurgical changes status post right mastectomy. IMPRESSION: 1. No evidence of local tumor recurrence status post right upper lobectomy. 2. Two tiny pulmonary nodules in the right lung, largest 5 mm, for which 12 month stability has been demonstrated, and which are probably benign. Recommend continued chest CT follow-up until 2 year stability has been demonstrated. 3. No evidence of metastatic disease in the chest. No acute pulmonary disease. Electronically Signed   By: JIlona SorrelM.D.   On: 06/09/2015 16:37    ASSESSMENT: Pathologic stage Ia adenocarcinoma of the right upper lobe lung, status post resection on May 01, 2014.  PLAN:    1. Lung cancer: CT scan results reviewed independently and reported as above.  Given patient's stage of disease she did not require any adjuvant chemotherapy. Because  she is a nonsmoker, can consider sending pathology for EGFR testing for future use if necessary. No further intervention is needed at this time. Return to clinic in 6 months with restaging CT scan and further evaluation. Will repeat imaging every 6 months for 2 years after her surgery and then yearly after that.  2. History of breast cancer: Patient had breast cancer in 1995. Genetic testing did not reveal any significant abnormalities. No further intervention is needed at this time.  Patient expressed understanding and was in agreement with this plan. She also understands that She can call clinic at any time with any questions, concerns, or complaints.     TLloyd Huger MD   06/20/2015 4:06 PM

## 2015-10-14 ENCOUNTER — Other Ambulatory Visit: Payer: Self-pay | Admitting: Specialist

## 2015-10-14 DIAGNOSIS — C3411 Malignant neoplasm of upper lobe, right bronchus or lung: Secondary | ICD-10-CM

## 2015-12-10 ENCOUNTER — Ambulatory Visit
Admission: RE | Admit: 2015-12-10 | Discharge: 2015-12-10 | Disposition: A | Discharge status: Home / Self Care | Payer: Federal, State, Local not specified - PPO | Source: Ambulatory Visit | Attending: Specialist | Admitting: Specialist

## 2015-12-10 DIAGNOSIS — Z9011 Acquired absence of right breast and nipple: Secondary | ICD-10-CM | POA: Insufficient documentation

## 2015-12-10 DIAGNOSIS — C3411 Malignant neoplasm of upper lobe, right bronchus or lung: Secondary | ICD-10-CM | POA: Diagnosis not present

## 2015-12-10 DIAGNOSIS — Z902 Acquired absence of lung [part of]: Secondary | ICD-10-CM | POA: Insufficient documentation

## 2015-12-10 DIAGNOSIS — R918 Other nonspecific abnormal finding of lung field: Secondary | ICD-10-CM | POA: Diagnosis not present

## 2015-12-10 MED ORDER — IOPAMIDOL (ISOVUE-300) INJECTION 61%
75.0000 mL | Freq: Once | INTRAVENOUS | Status: AC | PRN
  Administered 2015-12-10: 75 mL via INTRAVENOUS

## 2016-04-19 ENCOUNTER — Other Ambulatory Visit: Payer: Self-pay | Admitting: Internal Medicine

## 2016-04-19 DIAGNOSIS — Z1231 Encounter for screening mammogram for malignant neoplasm of breast: Secondary | ICD-10-CM

## 2016-04-27 DIAGNOSIS — M79642 Pain in left hand: Secondary | ICD-10-CM

## 2016-04-27 DIAGNOSIS — M79641 Pain in right hand: Secondary | ICD-10-CM | POA: Insufficient documentation

## 2016-05-13 ENCOUNTER — Ambulatory Visit
Admission: RE | Admit: 2016-05-13 | Discharge: 2016-05-13 | Disposition: A | Discharge status: Home / Self Care | Payer: Federal, State, Local not specified - PPO | Source: Ambulatory Visit | Attending: Internal Medicine | Admitting: Internal Medicine

## 2016-05-13 DIAGNOSIS — Z1231 Encounter for screening mammogram for malignant neoplasm of breast: Secondary | ICD-10-CM

## 2016-06-15 ENCOUNTER — Other Ambulatory Visit: Payer: Self-pay | Admitting: Specialist

## 2016-06-15 DIAGNOSIS — R918 Other nonspecific abnormal finding of lung field: Secondary | ICD-10-CM

## 2016-08-03 DIAGNOSIS — M7062 Trochanteric bursitis, left hip: Secondary | ICD-10-CM | POA: Insufficient documentation

## 2016-08-25 ENCOUNTER — Ambulatory Visit: Payer: Self-pay | Admitting: Urology

## 2016-08-27 ENCOUNTER — Encounter: Payer: Self-pay | Admitting: Urology

## 2016-08-27 ENCOUNTER — Ambulatory Visit (INDEPENDENT_AMBULATORY_CARE_PROVIDER_SITE_OTHER): Payer: Federal, State, Local not specified - PPO | Admitting: Urology

## 2016-08-27 VITALS — BP 127/76 | HR 62 | Ht 60.0 in | Wt 112.1 lb

## 2016-08-27 DIAGNOSIS — R3129 Other microscopic hematuria: Secondary | ICD-10-CM | POA: Diagnosis not present

## 2016-08-27 LAB — URINALYSIS, COMPLETE
BILIRUBIN UA: NEGATIVE
Glucose, UA: NEGATIVE
Ketones, UA: NEGATIVE
LEUKOCYTES UA: NEGATIVE
Nitrite, UA: NEGATIVE
PH UA: 5.5 (ref 5.0–7.5)
Protein, UA: NEGATIVE
Specific Gravity, UA: <1.005 — ABNORMAL LOW (ref 1.005–1.030)
Urobilinogen, Ur: 0.2 mg/dL (ref 0.2–1.0)

## 2016-08-27 LAB — MICROSCOPIC EXAMINATION: BACTERIA UA: NONE SEEN

## 2016-08-27 NOTE — Progress Notes (Signed)
08/27/2016 11:17 AM   Margaret Potts 1956-01-10 [id number]  Referring provider: Erby Pian, MD [place], [place] [address]  Chief Complaint  Patient presents with  . New Patient (Initial Visit)    hematuria    HPI: The patient is a 61 year old female who presents today for evaluation of microscopic hematuria. She has no history of gross hematuria or UTIs. She has no history of kidney stones. She denies any urinary symptoms including frequency, urgency, and incontinence. She has never smoked. She does have a history of breast cancer (1995) and lung cancer (2015). She has no evidence of current malignancy and participates in routine surveillance for these malignancies.  Her most recent urinalysis with microscopic hematuria had 4-10 red blood cells on 08/16/2016. Today her urinalysis is free of blood.     PMH: Past Medical History:  Diagnosis Date  . Breast cancer ([place])    RT with mastectomy in 1995  . Cognitive deficits   . Depression   . Hyperlipemia   . Hypertension   . Lung cancer ([place])    RT upper lobe removed in OCT 2015  . Lung nodule   . Memory loss   . Seizures [facility])     Surgical History: Past Surgical History:  Procedure Laterality Date  . BREAST BIOPSY Left    benign  . MASTECTOMY Right   . right mastectomy      Home Medications:  Allergies as of 08/27/2016   No Known Allergies     Medication List       Accurate as of 08/27/16 11:17 AM. Always use your most recent med list.          BEE PROPOLIS PO Take by mouth.   vitamin C 1000 MG tablet Take 1,000 mg by mouth daily.       Allergies: No Known Allergies  Family History: Family History  Problem Relation Age of Onset  . Breast cancer Paternal Aunt   . Hematuria Neg Hx   . Renal cancer Neg Hx     Social History:  reports that she has never smoked. She has never used smokeless tobacco. She reports that she drinks alcohol. She  reports that she does not use drugs.  ROS: UROLOGY Frequent Urination?: No Hard to postpone urination?: No Burning/pain with urination?: No Get up at night to urinate?: No Leakage of urine?: No Urine stream starts and stops?: No Trouble starting stream?: No Do you have to strain to urinate?: No Blood in urine?: No Urinary tract infection?: No Sexually transmitted disease?: No Injury to kidneys or bladder?: No Painful intercourse?: No Weak stream?: No Currently pregnant?: No Vaginal bleeding?: No Last menstrual period?: No  Gastrointestinal Nausea?: No Vomiting?: No Indigestion/heartburn?: No Diarrhea?: No Constipation?: No  Constitutional Fever: No Night sweats?: No Weight loss?: No Fatigue?: No  Skin Skin rash/lesions?: No Itching?: No  Eyes Blurred vision?: Yes Double vision?: No  Ears/Nose/Throat Sore throat?: No Sinus problems?: No  Hematologic/Lymphatic Swollen glands?: No Easy bruising?: No  Cardiovascular Leg swelling?: No Chest pain?: No  Respiratory Cough?: Yes Shortness of breath?: No  Endocrine Excessive thirst?: No  Musculoskeletal Back pain?: Yes Joint pain?: No  Neurological Headaches?: No Dizziness?: No  Psychologic Depression?: No Anxiety?: No  Physical Exam: BP 127/76   Pulse 62   Ht 5' (1.524 m)   Wt 112 lb 1.6 oz (50.8 kg)   BMI 21.89 kg/m   Constitutional:  Alert and oriented, No  acute distress. HEENT: [place] AT, moist mucus membranes.  Trachea midline, no masses. Cardiovascular: No clubbing, cyanosis, or edema. Respiratory: Normal respiratory effort, no increased work of breathing. GI: Abdomen is soft, nontender, nondistended, no abdominal masses GU: No CVA tenderness.  Skin: No rashes, bruises or suspicious lesions. Lymph: No cervical or inguinal adenopathy. Neurologic: Grossly intact, no focal deficits, moving all 4 extremities. Psychiatric: Normal mood and affect.  Laboratory Data: Lab Results  Component  Value Date   WBC 8.5 05/03/2014   HGB 11.7 (L) 05/03/2014   HCT 35.2 05/03/2014   MCV 91 05/03/2014   PLT 152 05/03/2014    Lab Results  Component Value Date   CREATININE 0.90 06/09/2015    No results found for: PSA  No results found for: TESTOSTERONE  No results found for: HGBA1C  Urinalysis No results found for: COLORURINE, APPEARANCEUR, LABSPEC, PHURINE, GLUCOSEU, HGBUR, BILIRUBINUR, KETONESUR, PROTEINUR, UROBILINOGEN, NITRITE, LEUKOCYTESUR   Assessment & Plan:    1. Microscopic hematuria -CT Urogram followed by office cystocopy   Return for after CT Urogram for cystoscopy.  Nickie Retort, MD  [facility] [address] [place], [place] [address] [phone]

## 2016-09-09 ENCOUNTER — Inpatient Hospital Stay: Admission: RE | Admit: 2016-09-09 | Payer: Federal, State, Local not specified - PPO | Source: Ambulatory Visit

## 2016-09-09 ENCOUNTER — Ambulatory Visit
Admission: RE | Admit: 2016-09-09 | Discharge: 2016-09-09 | Disposition: A | Discharge status: Home / Self Care | Payer: Federal, State, Local not specified - PPO | Source: Ambulatory Visit | Attending: Urology | Admitting: Urology

## 2016-09-09 DIAGNOSIS — R3129 Other microscopic hematuria: Secondary | ICD-10-CM | POA: Diagnosis present

## 2016-09-09 DIAGNOSIS — R918 Other nonspecific abnormal finding of lung field: Secondary | ICD-10-CM

## 2016-09-09 DIAGNOSIS — Z9011 Acquired absence of right breast and nipple: Secondary | ICD-10-CM | POA: Insufficient documentation

## 2016-09-09 DIAGNOSIS — Z902 Acquired absence of lung [part of]: Secondary | ICD-10-CM | POA: Diagnosis not present

## 2016-09-09 MED ORDER — IOPAMIDOL (ISOVUE-300) INJECTION 61%
125.0000 mL | Freq: Once | INTRAVENOUS | Status: AC | PRN
  Administered 2016-09-09: 125 mL via INTRAVENOUS

## 2016-09-15 ENCOUNTER — Ambulatory Visit (INDEPENDENT_AMBULATORY_CARE_PROVIDER_SITE_OTHER): Payer: Federal, State, Local not specified - PPO | Admitting: Urology

## 2016-09-15 VITALS — BP 139/83 | HR 56 | Ht 60.0 in | Wt 112.3 lb

## 2016-09-15 DIAGNOSIS — R3129 Other microscopic hematuria: Secondary | ICD-10-CM | POA: Diagnosis not present

## 2016-09-15 MED ORDER — LIDOCAINE HCL 2 % EX GEL
1.0000 "application " | Freq: Once | CUTANEOUS | Status: AC
  Administered 2016-09-15: 1 via URETHRAL

## 2016-09-15 MED ORDER — CIPROFLOXACIN HCL 500 MG PO TABS
500.0000 mg | ORAL_TABLET | Freq: Once | ORAL | Status: AC
  Administered 2016-09-15: 500 mg via ORAL

## 2016-09-15 NOTE — Progress Notes (Signed)
   09/15/16  CC:  Chief Complaint  Patient presents with  . Cysto    hematuria     HPI: The patient is a 61 year old female who presents today for evaluation of microscopic hematuria. She has no history of gross hematuria or UTIs. She has no history of kidney stones. She denies any urinary symptoms including frequency, urgency, and incontinence. She has never smoked. She does have a history of breast cancer (1995) and lung cancer (2015). She has no evidence of current malignancy and participates in routine surveillance for these malignancies.  Her most recent urinalysis with microscopic hematuria had 4-10 red blood cells on 08/16/2016. Today her urinalysis is free of blood.    CT Urogram negative for malignancy.  There were no vitals taken for this visit. NED. A&Ox3.   No respiratory distress   Abd soft, NT, ND Normal external genitalia with patent urethral meatus  Cystoscopy Procedure Note  Patient identification was confirmed, informed consent was obtained, and patient was prepped using Betadine solution.  Lidocaine jelly was administered per urethral meatus.    Preoperative abx where received prior to procedure.    Procedure: - Flexible cystoscope introduced, without any difficulty.   - Thorough search of the bladder revealed:    normal urethral meatus    normal urothelium    no stones    no ulcers     no tumors    no urethral polyps    no trabeculation  - Ureteral orifices were normal in position and appearance.  Post-Procedure: - Patient tolerated the procedure well  Assessment/ Plan:  1. Microscopic hematuria -negative work up. Follow up in one year for repeat urinalysis.  Nickie Retort, MD

## 2016-09-16 LAB — URINALYSIS, COMPLETE
Bilirubin, UA: NEGATIVE
Glucose, UA: NEGATIVE
KETONES UA: NEGATIVE
LEUKOCYTES UA: NEGATIVE
Nitrite, UA: NEGATIVE
Protein, UA: NEGATIVE
RBC, UA: NEGATIVE
Urobilinogen, Ur: 0.2 mg/dL (ref 0.2–1.0)
pH, UA: 5.5 (ref 5.0–7.5)

## 2016-09-16 LAB — MICROSCOPIC EXAMINATION
BACTERIA UA: NONE SEEN
EPITHELIAL CELLS (NON RENAL): NONE SEEN /HPF (ref 0–10)
RBC, UA: NONE SEEN /hpf (ref 0–?)

## 2016-10-11 ENCOUNTER — Ambulatory Visit: Payer: Federal, State, Local not specified - PPO

## 2016-10-26 DIAGNOSIS — M79671 Pain in right foot: Secondary | ICD-10-CM | POA: Insufficient documentation

## 2016-12-28 DIAGNOSIS — G8929 Other chronic pain: Secondary | ICD-10-CM | POA: Insufficient documentation

## 2016-12-28 DIAGNOSIS — M79622 Pain in left upper arm: Secondary | ICD-10-CM | POA: Insufficient documentation

## 2016-12-28 DIAGNOSIS — M25552 Pain in left hip: Secondary | ICD-10-CM

## 2017-01-15 ENCOUNTER — Emergency Department
Admission: EM | Admit: 2017-01-15 | Discharge: 2017-01-16 | Disposition: A | Discharge status: Home / Self Care | Payer: Federal, State, Local not specified - PPO | Attending: Emergency Medicine | Admitting: Emergency Medicine

## 2017-01-15 DIAGNOSIS — K625 Hemorrhage of anus and rectum: Secondary | ICD-10-CM | POA: Diagnosis present

## 2017-01-15 DIAGNOSIS — Z853 Personal history of malignant neoplasm of breast: Secondary | ICD-10-CM | POA: Insufficient documentation

## 2017-01-15 DIAGNOSIS — Z85118 Personal history of other malignant neoplasm of bronchus and lung: Secondary | ICD-10-CM | POA: Diagnosis not present

## 2017-01-15 DIAGNOSIS — R1084 Generalized abdominal pain: Secondary | ICD-10-CM | POA: Diagnosis not present

## 2017-01-15 DIAGNOSIS — I10 Essential (primary) hypertension: Secondary | ICD-10-CM | POA: Insufficient documentation

## 2017-01-15 LAB — COMPREHENSIVE METABOLIC PANEL
ALBUMIN: 4.2 g/dL (ref 3.5–5.0)
ALT: 19 U/L (ref 14–54)
AST: 24 U/L (ref 15–41)
Alkaline Phosphatase: 50 U/L (ref 38–126)
Anion gap: 7 (ref 5–15)
BUN: 15 mg/dL (ref 6–20)
CHLORIDE: 105 mmol/L (ref 101–111)
CO2: 27 mmol/L (ref 22–32)
CREATININE: 0.67 mg/dL (ref 0.44–1.00)
Calcium: 9.3 mg/dL (ref 8.9–10.3)
GFR calc Af Amer: >60 mL/min (ref >60)
GLUCOSE: 149 mg/dL — AB (ref 65–99)
POTASSIUM: 3.5 mmol/L (ref 3.5–5.1)
SODIUM: 139 mmol/L (ref 135–145)
Total Bilirubin: 0.7 mg/dL (ref 0.3–1.2)
Total Protein: 6.8 g/dL (ref 6.5–8.1)

## 2017-01-15 LAB — CBC
HEMATOCRIT: 38.4 % (ref 35.0–47.0)
Hemoglobin: 13.2 g/dL (ref 12.0–16.0)
MCH: 31.1 pg (ref 26.0–34.0)
MCHC: 34.5 g/dL (ref 32.0–36.0)
MCV: 90.2 fL (ref 80.0–100.0)
PLATELETS: 204 10*3/uL (ref 150–440)
RBC: 4.26 MIL/uL (ref 3.80–5.20)
RDW: 12.9 % (ref 11.5–14.5)
WBC: 6 10*3/uL (ref 3.6–11.0)

## 2017-01-15 MED ORDER — IOPAMIDOL (ISOVUE-300) INJECTION 61%
15.0000 mL | INTRAVENOUS | Status: AC
  Administered 2017-01-15: 15 mL via ORAL

## 2017-01-15 NOTE — ED Triage Notes (Signed)
Pt present via POV c/o bloody stool. Reports had some generalized abd pain prior to BM. Denies abd pain currently. Also reports left ear pain intermittently for a couple weeks, worse today. Reports nauseous a couple days ago with eating.

## 2017-01-15 NOTE — ED Notes (Signed)
See triage note. Pt reports one episode of bloody stool. Pt is ambulatory at this time and in NAD.

## 2017-01-16 ENCOUNTER — Emergency Department: Payer: Federal, State, Local not specified - PPO

## 2017-01-16 ENCOUNTER — Encounter: Payer: Self-pay | Admitting: Radiology

## 2017-01-16 DIAGNOSIS — K625 Hemorrhage of anus and rectum: Secondary | ICD-10-CM | POA: Diagnosis not present

## 2017-01-16 MED ORDER — IOPAMIDOL (ISOVUE-300) INJECTION 61%
100.0000 mL | Freq: Once | INTRAVENOUS | Status: AC | PRN
  Administered 2017-01-16: 100 mL via INTRAVENOUS

## 2017-01-16 NOTE — Discharge Instructions (Signed)
You have been seen in the Emergency Department (ED) for GI bleeding.  Your evaluation did not identify a clear cause of your symptoms but was generally reassuring.  You do not require admission to the hospital at this time, but we encourage close outpatient follow up with the doctor(s) listed above in this paperwork.  In particular, you need to try and schedule a colonoscopy for the next available opportunity.  Return to the ED if your abdominal pain worsens or fails to improve, you are unable to tolerate fluids due to vomiting, fever greater than 101, or you develop other symptoms that concern you, including persistent or worsening rectal bleeding.

## 2017-01-16 NOTE — ED Provider Notes (Signed)
[facility] Emergency Department Provider Note  ____________________________________________   First MD Initiated Contact with Patient 01/15/17 2317     (approximate)  I have reviewed the triage vital signs and the nursing notes.   HISTORY  Chief Complaint Rectal Bleeding    HPI Margaret Potts is a 61 y.o. female who is generally healthy and presents for evaluation of acute onset of bright red blood per rectum.  She states that she has been feeling generally well recently but this evening she had acute onset of some mild generalized aching abdominal pain.  The pain has since resolved and she is currently not experiencing any abdominal pain.  She went to the bathroom and had a bowel movement and reports that there was a significant amount of blood mixed in with the stool.  She has not had any additional episodes.  She has not had rectal bleeding in the past.  She had several episodes of nausea several days ago when she was eating, but has not had any recent nausea, vomiting, diarrhea, nor constipation.  She does not think she has ever had hemorrhoids.  She denies fever/chills, chest pain, shortness of breath.  She has had some pain in her left ear for the last few weeks.  She is concerned because she leaves and 2 days to go on a vacation to [place] for a week so felt like she should get evaluated before she left.  Her primary care doctor is Dr. Doy Hutching.  She takes no anticoagulation medications and has no history of which she is aware of diverticular disease.  Past Medical History:  Diagnosis Date  . Breast cancer ([place])    RT with mastectomy in 1995  . Cognitive deficits   . Depression   . Hyperlipemia   . Hypertension   . Lung cancer ([place])    RT upper lobe removed in OCT 2015  . Lung nodule   . Memory loss   . Seizures [facility])     Patient Active Problem List   Diagnosis Date Noted  . Hyperlipemia   . Cognitive deficits   . Memory loss   . Lung nodule      Past Surgical History:  Procedure Laterality Date  . BREAST BIOPSY Left    benign  . LUNG LOBECTOMY    . MASTECTOMY Right   . right mastectomy      Prior to Admission medications   Medication Sig Start Date End Date Taking? Authorizing Provider  Ascorbic Acid (VITAMIN C) 1000 MG tablet Take 1,000 mg by mouth daily.    [provider]  Misc Natural Products (BEE PROPOLIS PO) Take by mouth.    [provider]    Allergies Patient has no known allergies.  Family History  Problem Relation Age of Onset  . Breast cancer Paternal Aunt   . Hematuria Neg Hx   . Renal cancer Neg Hx     Social History Social History  Substance Use Topics  . Smoking status: Never Smoker  . Smokeless tobacco: Never Used  . Alcohol use Yes     Comment: occassionally    Review of Systems Constitutional: No fever/chills Eyes: No visual changes. ENT: No sore throat. Cardiovascular: Denies chest pain. Respiratory: Denies shortness of breath. Gastrointestinal: Acute onset generalized abdominal pain with bright red blood per rectum mixed in with bowel movement Genitourinary: Negative for dysuria. Musculoskeletal: Negative for neck pain.  Negative for back pain. Integumentary: Negative for rash. Neurological: Negative for headaches, focal  weakness or numbness.   ____________________________________________   PHYSICAL EXAM:  VITAL SIGNS: ED Triage Vitals  Enc Vitals Group     BP 01/15/17 2232 (!) 165/85     Pulse Rate 01/15/17 2232 72     Resp 01/15/17 2232 14     Temp 01/15/17 2232 99.4 F (37.4 C)     Temp Source 01/15/17 2232 Oral     SpO2 01/15/17 2232 100 %     Weight 01/15/17 2233 52.2 kg (115 lb)     Height 01/15/17 2233 1.524 m (5')     Head Circumference --      Peak Flow --      Pain Score --      Pain Loc --      Pain Edu? --      Excl. in [place]? --     Constitutional: Alert and oriented. Well appearing and in no acute distress. Eyes: Conjunctivae are  normal.  Head: Atraumatic. Nose: No congestion/rhinnorhea. Mouth/Throat: Mucous membranes are moist. Neck: No stridor.  No meningeal signs.   Cardiovascular: Normal rate, regular rhythm. Good peripheral circulation. Grossly normal heart sounds. Respiratory: Normal respiratory effort.  No retractions. Lungs CTAB. Gastrointestinal: Soft and nontender. No distention.  Small nonthrombosed external hemorrhoid.  Minimal stool in rectal vault, Hemoccult negative, quality control passed.    ED chaperone present throughout exam.   Musculoskeletal: No lower extremity tenderness nor edema. No gross deformities of extremities. Neurologic:  Normal speech and language. No gross focal neurologic deficits are appreciated.  Skin:  Skin is warm, dry and intact. No rash noted. Psychiatric: Mood and affect are normal. Speech and behavior are normal.  ____________________________________________   LABS (all labs ordered are listed, but only abnormal results are displayed)  Labs Reviewed  COMPREHENSIVE METABOLIC PANEL - Abnormal; Notable for the following:       Result Value   Glucose, Bld 149 (*)    All other components within normal limits  CBC  POC OCCULT BLOOD, ED   ____________________________________________  EKG  None - EKG not ordered by ED physician ____________________________________________  RADIOLOGY   Ct Abdomen Pelvis W Contrast  Result Date: 01/16/2017 CLINICAL DATA:  Acute onset of generalized abdominal pain and nausea. Bloody stool. Initial encounter. EXAM: CT ABDOMEN AND PELVIS WITH CONTRAST TECHNIQUE: Multidetector CT imaging of the abdomen and pelvis was performed using the standard protocol following bolus administration of intravenous contrast. CONTRAST:  167mL ISOVUE-300 IOPAMIDOL (ISOVUE-300) INJECTION 61% COMPARISON:  CT of the abdomen and pelvis from 09/09/2016 FINDINGS: Lower chest: Minimal left basilar atelectasis is noted. The visualized portions of the mediastinum are  unremarkable. Hepatobiliary: The liver is unremarkable in appearance. The gallbladder is unremarkable in appearance. The common bile duct remains normal in caliber. Pancreas: The pancreas is within normal limits. Spleen: The spleen is unremarkable in appearance. Adrenals/Urinary Tract: The adrenal glands are unremarkable in appearance. A small left renal cyst is noted. There is no evidence of hydronephrosis. No renal or ureteral stones are identified. No perinephric stranding is seen. Stomach/Bowel: The stomach is unremarkable in appearance. The small bowel is within normal limits. The appendix is normal in caliber, without evidence of appendicitis. There is question of mild wall thickening at the distal sigmoid colon. This may reflect a mild infectious process, though a mass cannot be entirely excluded. Sigmoidoscopy could be considered for further evaluation, as deemed clinically appropriate. Vascular/Lymphatic: The abdominal aorta is unremarkable in appearance. The inferior vena cava is grossly unremarkable. No retroperitoneal lymphadenopathy is  seen. No pelvic sidewall lymphadenopathy is identified. Reproductive: The bladder is mildly distended and grossly unremarkable. The uterus is unremarkable in appearance. The ovaries are relatively symmetric. No suspicious adnexal masses are seen. Other: No additional soft tissue abnormalities are seen. Musculoskeletal: No acute osseous abnormalities are identified. The visualized musculature is unremarkable in appearance. IMPRESSION: 1. Question of mild wall thickening at the distal sigmoid colon. This may reflect a mild infectious process, though a mass cannot be entirely excluded. Sigmoidoscopy could be considered for further evaluation, as deemed clinically appropriate. 2. Minimal left basilar atelectasis noted.  Lungs otherwise clear. 3. Small left renal cyst noted. Electronically Signed   By: Garald Balding M.D.   On: 01/16/2017 02:50     ____________________________________________   PROCEDURES  Critical Care performed: No   Procedure(s) performed:   Procedures   ____________________________________________   INITIAL IMPRESSION / ASSESSMENT AND PLAN / ED COURSE  Pertinent labs & imaging results that were available during my care of the patient were reviewed by me and considered in my medical decision making (see chart for details).  The patient reports a single episode of rectal bleeding mixed with stool which is the first time she has experienced this.  She has a very small and non-thrombosed external hemorrhoid but no blood (occult negative) on digital rectal exam.  Given that she is going to be flying to [place] in less than 2 days, I will obtain a CT scan of the abdomen and pelvis to rule out any acute or emergent issues on the scan although I recognize that it has a limited ability to identify signs or sources of rectal bleeding.  She is comfortable with this plan.  She is in no acute distress at this time even though she did have some abdominal pain earlier.  Most likely she will benefit from close outpatient follow-up with GI.  I have encouraged her that if she feels the need to have another bowel movement she should do so so we can determine if she is continuing to bleed in spite of a negative Hemoccult.  Differential diagnosis includes all of the usual causes of rectal bleeding including but not limited to diverticulosis, A/V malformation, mass/neoplasm, hemorrhoids, upper GI bleed, etc.   Clinical Course as of Jan 17 339  Sun Jan 16, 2017  0330 Patient has been asymptomatic during the 5+ hours she has been in the emergency department.  She has had no additional bowel movements since the original when she had at least 8 hours ago.  She occasionally feels some vague mild cramping in her abdomen but denies any pain.  We had a long discussion about her symptoms and the results of the CT scan.  At this point I  do not feel she would benefit from admission to the hospital and is appropriate for close outpatient follow-up.  I recommended that she continue on her trip that is scheduled to start about 30 hours from now, but encouraged her first to touch base with GI to schedule the next available follow-up appointment when she gets home for a colonoscopy.  I will send a message to Dr. Doy Hutching through [facility] so he is aware as well.  I gave her strict precautions that should her rectal bleeding continue or worsen, she should go to the nearest emergency department.  She understands and agrees with the plan.  She has no infectious signs or symptoms, no leukocytosis, no diarrhea, and I do not feel she would benefit from empiric antibiotics.  I let her know about this as well.  [CF]    Clinical Course User Index [CF] Hinda Kehr, MD    ____________________________________________  FINAL CLINICAL IMPRESSION(S) / ED DIAGNOSES  Final diagnoses:  Rectal bleeding     MEDICATIONS GIVEN DURING THIS VISIT:  Medications  iopamidol (ISOVUE-300) 61 % injection 15 mL (15 mLs Oral Contrast Given 01/15/17 2344)  iopamidol (ISOVUE-300) 61 % injection 100 mL (100 mLs Intravenous Contrast Given 01/16/17 0216)     NEW OUTPATIENT MEDICATIONS STARTED DURING THIS VISIT:  New Prescriptions   No medications on file    Modified Medications   No medications on file    Discontinued Medications   No medications on file     Note:  This document was prepared using Dragon voice recognition software and may include unintentional dictation errors.    Hinda Kehr, MD 01/16/17 [phone]

## 2017-02-02 ENCOUNTER — Other Ambulatory Visit: Payer: Self-pay

## 2017-02-02 ENCOUNTER — Telehealth: Payer: Self-pay

## 2017-02-02 ENCOUNTER — Encounter: Payer: Self-pay | Admitting: Gastroenterology

## 2017-02-02 ENCOUNTER — Ambulatory Visit (INDEPENDENT_AMBULATORY_CARE_PROVIDER_SITE_OTHER): Payer: Federal, State, Local not specified - PPO | Admitting: Gastroenterology

## 2017-02-02 VITALS — BP 173/81 | HR 61 | Temp 98.3°F | Ht 60.0 in | Wt 112.4 lb

## 2017-02-02 DIAGNOSIS — K625 Hemorrhage of anus and rectum: Secondary | ICD-10-CM

## 2017-02-02 DIAGNOSIS — C50919 Malignant neoplasm of unspecified site of unspecified female breast: Secondary | ICD-10-CM | POA: Insufficient documentation

## 2017-02-02 DIAGNOSIS — K921 Melena: Secondary | ICD-10-CM | POA: Diagnosis not present

## 2017-02-02 DIAGNOSIS — N951 Menopausal and female climacteric states: Secondary | ICD-10-CM | POA: Insufficient documentation

## 2017-02-02 DIAGNOSIS — Z9109 Other allergy status, other than to drugs and biological substances: Secondary | ICD-10-CM | POA: Insufficient documentation

## 2017-02-02 NOTE — Telephone Encounter (Signed)
-----   Message from Jonathon Bellows, MD sent at 01/17/2017  9:58 AM EDT ----- Regarding: FW: rectal bleeding follow up [name]  Can you call the patient in 10-14 days from today to check on her and have her come and see me in the office pretty soon , looks like she will need a colonoscopy. You can discuss dates on the phone and start the process before she comes in   [place]  ----- Message ----- From: Hinda Kehr, MD Sent: 01/16/2017   3:32 AM To: Idelle Crouch, MD, Jonathon Bellows, MD Subject: rectal bleeding follow up                      Margaret Potts (and Margaret Potts),   I saw Margaret Potts tonight for one episode of bright red blood per rectum during a bowel movement with some vague mild cramping.  She has been asymptomatic for 5+ hours in the ED and last BM was about 8 hours ago.  Negative [place] exam with no blood (and in fact almost no stool) in rectal vault.  CT showed a small area of colonic thickening, "mass cannot be entirely excluded, recommend sigmoidoscopy" per radiology.  States she's due for another colonoscopy in about a year anyway.  She leaves for a trip to [place] (!) on Monday morning.  She's stable with no leukocytosis, normal hemoglobin, etc.  I encouraged her to keep her trip, but gave strict precautions about going to the nearest ED if her symptoms worsen.  Told her I would let you know about the visit, and gave her Dr. Bailey Potts Anna's number (GI) -- hopefully she can get scheduled for a colonoscopy as soon as possible after she returns.  On second thought, I just figured out I could CC this message to Dr. Vicente Males as well.  Maybe this will be helpful too!  Just wanted to let you know.  I appreciate any follow-up assistance either of you can provide for her.  Take care,    -- Tommi Rumps

## 2017-02-02 NOTE — Progress Notes (Signed)
Jonathon Bellows MD, MRCP(U.K) [address]  [place]  [place], [place] [address]  Main: [phone]  Fax: [phone]   Gastroenterology Consultation  Referring Provider:     Idelle Crouch, MD Primary Care Physician:  Idelle Crouch, MD Primary Gastroenterologist:  Dr. Jonathon Bellows  Reason for Consultation:     Rectal bleeding         HPI:   Margaret Potts is a 61 y.o. y/o female referred for consultation & management  by Dr. Doy Hutching, Leonie Douglas, MD.     She has been referred by the ER for rectal bleeding . She was seen after an ER visit by [facility] clinic for rectal bleeding , subsequently she was given an appointment for a colonoscopy in December 2018. She called our office for a sooner appointment and is here to discuss further.   Prior imaging CT scan 01/16/17 - questionable mild thickening of the distal sigmoid colon - mass not excluded and sigmoidoscopy. No mention of diverticulitis on scan or diverticulosis.  recommended. Hb 13.2 grams 01/15/17 . She has apparently had a colonoscopy in 2015 by Dr Rayann Heman and a polyp was excised. H/o lung cancer in 2015 treated by surgery .  Interval history   01/15/2017-  02/02/2017   She says that she only had 1 episode of rectal bleeding on the day of her ER visit on 01/15/17 . "lots of blood" blood mixed in the stool. The rectal bleeding ws proceeded by some abdominal discomfort for a few weeks, had some bloating . She subsequently had another episode the same day and then it stopped. Presently says her stool is "dark black", not on any iron , not on any NSAID's, some left sided abdominal discomfort all day long , ongoing since last few weeks, not getting better, rather getting worse. Dr Doy Hutching commenced her on Cipro and flagyl which she just completed but she cannot sense any improvement in her symptoms. Denies any weight loss.  Mother had some form of cancer ?liver cancer, brother had lung smoker, he was a smoker and consumed a lot of alcohol.  Her father lived till 50. She herself had lung and breast cancer and recalls undergoing cancer genetic testing .   On omeprazole 20 mg , was taking it at noon , she will take it in the morning on an empty stomach .   Past Medical History:  Diagnosis Date  . Breast cancer ([place])    RT with mastectomy in 1995  . Cognitive deficits   . Depression   . Hyperlipemia   . Hypertension   . Lung cancer ([place])    RT upper lobe removed in OCT 2015  . Lung nodule   . Memory loss   . Seizures ([place])     Past Surgical History:  Procedure Laterality Date  . BREAST BIOPSY Left    benign  . LUNG LOBECTOMY    . MASTECTOMY Right   . right mastectomy      Prior to Admission medications   Medication Sig Start Date End Date Taking? Authorizing Provider  omeprazole (PRILOSEC) 20 MG capsule  01/27/17  Yes [provider]  Ascorbic Acid (VITAMIN C) 1000 MG tablet Take 1,000 mg by mouth daily.    [provider]  benzonatate (TESSALON) 100 MG capsule  12/21/16   [provider]  Misc Natural Products (BEE PROPOLIS PO) Take by mouth.    [provider]  olopatadine (PATANOL) 0.1 % ophthalmic solution Apply to eye.  09/16/16   [provider]  vitamin E 400 UNIT capsule Take by mouth.    [provider]    Family History  Problem Relation Age of Onset  . Breast cancer Paternal Aunt   . Liver cancer Mother   . Lung cancer Sister   . Liver cancer Brother   . Hematuria Neg Hx   . Renal cancer Neg Hx      Social History  Substance Use Topics  . Smoking status: Never Smoker  . Smokeless tobacco: Never Used  . Alcohol use Yes     Comment: occassionally    Allergies as of 02/02/2017  . (No Known Allergies)    Review of Systems:    All systems reviewed and negative except where noted in HPI.   Physical Exam:  BP (!) 173/81 (BP Location: Left Arm, Patient Position: Sitting, Cuff Size: Normal)   Pulse 61   Temp 98.3 F (36.8 C) (Oral)   Ht  5' (1.524 m)   Wt 112 lb 6.4 oz (51 kg)   BMI 21.95 kg/m  No LMP recorded. Patient is postmenopausal. Psych:  Alert and cooperative. Normal mood and affect. General:   Alert,  Well-developed, well-nourished, pleasant and cooperative in NAD Head:  Normocephalic and atraumatic. Eyes:  Sclera clear, no icterus.   Conjunctiva pink. Ears:  Normal auditory acuity. Nose:  No deformity, discharge, or lesions. Mouth:  No deformity or lesions,oropharynx pink & moist. Neck:  Supple; no masses or thyromegaly. Lungs:  Respirations even and unlabored.  Clear throughout to auscultation.   No wheezes, crackles, or rhonchi. No acute distress. Heart:  Regular rate and rhythm; no murmurs, clicks, rubs, or gallops. Abdomen:  Normal bowel sounds.  No bruits.  Soft, mild luq tenderness  and non-distended without masses, hepatosplenomegaly or hernias noted.  No guarding or rebound tenderness.    Extremities:  No clubbing or edema.  No cyanosis. Neurologic:  Alert and oriented x3;  grossly normal neurologically. Skin:  Intact without significant lesions or rashes. No jaundice. Lymph Nodes:  No significant cervical adenopathy. Psych:  Alert and cooperative. Normal mood and affect.  Imaging Studies: Ct Abdomen Pelvis W Contrast  Result Date: 01/16/2017 CLINICAL DATA:  Acute onset of generalized abdominal pain and nausea. Bloody stool. Initial encounter. EXAM: CT ABDOMEN AND PELVIS WITH CONTRAST TECHNIQUE: Multidetector CT imaging of the abdomen and pelvis was performed using the standard protocol following bolus administration of intravenous contrast. CONTRAST:  144mL ISOVUE-300 IOPAMIDOL (ISOVUE-300) INJECTION 61% COMPARISON:  CT of the abdomen and pelvis from 09/09/2016 FINDINGS: Lower chest: Minimal left basilar atelectasis is noted. The visualized portions of the mediastinum are unremarkable. Hepatobiliary: The liver is unremarkable in appearance. The gallbladder is unremarkable in appearance. The common bile duct  remains normal in caliber. Pancreas: The pancreas is within normal limits. Spleen: The spleen is unremarkable in appearance. Adrenals/Urinary Tract: The adrenal glands are unremarkable in appearance. A small left renal cyst is noted. There is no evidence of hydronephrosis. No renal or ureteral stones are identified. No perinephric stranding is seen. Stomach/Bowel: The stomach is unremarkable in appearance. The small bowel is within normal limits. The appendix is normal in caliber, without evidence of appendicitis. There is question of mild wall thickening at the distal sigmoid colon. This may reflect a mild infectious process, though a mass cannot be entirely excluded. Sigmoidoscopy could be considered for further evaluation, as deemed clinically appropriate. Vascular/Lymphatic: The abdominal aorta is unremarkable in appearance. The inferior vena cava is grossly  unremarkable. No retroperitoneal lymphadenopathy is seen. No pelvic sidewall lymphadenopathy is identified. Reproductive: The bladder is mildly distended and grossly unremarkable. The uterus is unremarkable in appearance. The ovaries are relatively symmetric. No suspicious adnexal masses are seen. Other: No additional soft tissue abnormalities are seen. Musculoskeletal: No acute osseous abnormalities are identified. The visualized musculature is unremarkable in appearance. IMPRESSION: 1. Question of mild wall thickening at the distal sigmoid colon. This may reflect a mild infectious process, though a mass cannot be entirely excluded. Sigmoidoscopy could be considered for further evaluation, as deemed clinically appropriate. 2. Minimal left basilar atelectasis noted.  Lungs otherwise clear. 3. Small left renal cyst noted. Electronically Signed   By: Garald Balding M.D.   On: 01/16/2017 02:50    Assessment and Plan:   Margaret Potts is a 61 y.o. y/o female has been referred for rectal bleeding on one occasion and ongoing "dark stool" some LUQ discomfort  ongoing for a few weeks and CT scan concerning for abnormal thickening of the sigmoid colon which warrants urgent evaluation.   Plan  1. EGD+colonoscopy on Wednesday at [facility] 2. Continue PPI  I have discussed alternative options, risks & benefits,  which include, but are not limited to, bleeding, infection, perforation,respiratory complication & drug reaction.  The patient agrees with this plan & written consent will be obtained.     Follow up in 6 weeks   Dr Jonathon Bellows MD,MRCP(U.K)

## 2017-02-02 NOTE — Telephone Encounter (Signed)
LVM for patient to contact Dr. Donnelly Angelica, GI concerning December procedure and her current complications.   [phone]

## 2017-02-03 ENCOUNTER — Telehealth: Payer: Self-pay | Admitting: Gastroenterology

## 2017-02-03 NOTE — Telephone Encounter (Signed)
02/03/17 [employer]   NO prior auth required for EGD [employer] [facility] [phone] K62.5 Vicente Males, [place], 02/09/17)

## 2017-02-07 NOTE — Discharge Instructions (Signed)
General Anesthesia, Adult, Care After °These instructions provide you with information about caring for yourself after your procedure. Your health care provider may also give you more specific instructions. Your treatment has been planned according to current medical practices, but problems sometimes occur. Call your health care provider if you have any problems or questions after your procedure. °What can I expect after the procedure? °After the procedure, it is common to have: °· Vomiting. °· A sore throat. °· Mental slowness. ° °It is common to feel: °· Nauseous. °· Cold or shivery. °· Sleepy. °· Tired. °· Sore or achy, even in parts of your body where you did not have surgery. ° °Follow these instructions at home: °For at least 24 hours after the procedure: °· Do not: °? Participate in activities where you could fall or become injured. °? Drive. °? Use heavy machinery. °? Drink alcohol. °? Take sleeping pills or medicines that cause drowsiness. °? Make important decisions or sign legal documents. °? Take care of children on your own. °· Rest. °Eating and drinking °· If you vomit, drink water, juice, or soup when you can drink without vomiting. °· Drink enough fluid to keep your urine clear or pale yellow. °· Make sure you have little or no nausea before eating solid foods. °· Follow the diet recommended by your health care provider. °General instructions °· Have a responsible adult stay with you until you are awake and alert. °· Return to your normal activities as told by your health care provider. Ask your health care provider what activities are safe for you. °· Take over-the-counter and prescription medicines only as told by your health care provider. °· If you smoke, do not smoke without supervision. °· Keep all follow-up visits as told by your health care provider. This is important. °Contact a health care provider if: °· You continue to have nausea or vomiting at home, and medicines are not helpful. °· You  cannot drink fluids or start eating again. °· You cannot urinate after 8-12 hours. °· You develop a skin rash. °· You have fever. °· You have increasing redness at the site of your procedure. °Get help right away if: °· You have difficulty breathing. °· You have chest pain. °· You have unexpected bleeding. °· You feel that you are having a life-threatening or urgent problem. °This information is not intended to replace advice given to you by your health care provider. Make sure you discuss any questions you have with your health care provider. °Document Released: 10/04/2000 Document Revised: 12/01/2015 Document Reviewed: 06/12/2015 °Elsevier Interactive Patient Education © 2018 Elsevier Inc. ° °

## 2017-02-09 ENCOUNTER — Ambulatory Visit: Payer: Federal, State, Local not specified - PPO | Admitting: Anesthesiology

## 2017-02-09 ENCOUNTER — Ambulatory Visit
Admission: RE | Admit: 2017-02-09 | Discharge: 2017-02-09 | Disposition: A | Discharge status: Home / Self Care | Payer: Federal, State, Local not specified - PPO | Source: Ambulatory Visit | Attending: Gastroenterology | Admitting: Gastroenterology

## 2017-02-09 ENCOUNTER — Encounter
Admission: RE | Disposition: A | Discharge status: Home / Self Care | Payer: Self-pay | Source: Ambulatory Visit | Attending: Gastroenterology

## 2017-02-09 DIAGNOSIS — Z85118 Personal history of other malignant neoplasm of bronchus and lung: Secondary | ICD-10-CM | POA: Insufficient documentation

## 2017-02-09 DIAGNOSIS — K640 First degree hemorrhoids: Secondary | ICD-10-CM | POA: Diagnosis not present

## 2017-02-09 DIAGNOSIS — Z853 Personal history of malignant neoplasm of breast: Secondary | ICD-10-CM | POA: Insufficient documentation

## 2017-02-09 DIAGNOSIS — F329 Major depressive disorder, single episode, unspecified: Secondary | ICD-10-CM | POA: Diagnosis not present

## 2017-02-09 DIAGNOSIS — Z79899 Other long term (current) drug therapy: Secondary | ICD-10-CM | POA: Diagnosis not present

## 2017-02-09 DIAGNOSIS — K219 Gastro-esophageal reflux disease without esophagitis: Secondary | ICD-10-CM | POA: Insufficient documentation

## 2017-02-09 DIAGNOSIS — D125 Benign neoplasm of sigmoid colon: Secondary | ICD-10-CM | POA: Diagnosis not present

## 2017-02-09 DIAGNOSIS — E785 Hyperlipidemia, unspecified: Secondary | ICD-10-CM | POA: Diagnosis not present

## 2017-02-09 DIAGNOSIS — M1990 Unspecified osteoarthritis, unspecified site: Secondary | ICD-10-CM | POA: Insufficient documentation

## 2017-02-09 DIAGNOSIS — I10 Essential (primary) hypertension: Secondary | ICD-10-CM | POA: Insufficient documentation

## 2017-02-09 DIAGNOSIS — K921 Melena: Secondary | ICD-10-CM | POA: Diagnosis not present

## 2017-02-09 DIAGNOSIS — Z9011 Acquired absence of right breast and nipple: Secondary | ICD-10-CM | POA: Insufficient documentation

## 2017-02-09 DIAGNOSIS — K635 Polyp of colon: Secondary | ICD-10-CM | POA: Diagnosis not present

## 2017-02-09 HISTORY — DX: Unspecified osteoarthritis, unspecified site: M19.90

## 2017-02-09 HISTORY — PX: ESOPHAGOGASTRODUODENOSCOPY: SHX5428

## 2017-02-09 HISTORY — PX: POLYPECTOMY: SHX5525

## 2017-02-09 HISTORY — PX: COLONOSCOPY WITH PROPOFOL: SHX5780

## 2017-02-09 SURGERY — COLONOSCOPY WITH PROPOFOL
Anesthesia: General

## 2017-02-09 SURGERY — ESOPHAGOGASTRODUODENOSCOPY (EGD) WITH PROPOFOL
Anesthesia: Choice

## 2017-02-09 MED ORDER — LIDOCAINE HCL (CARDIAC) 20 MG/ML IV SOLN
INTRAVENOUS | Status: DC | PRN
  Administered 2017-02-09: 50 mg via INTRAVENOUS

## 2017-02-09 MED ORDER — STERILE WATER FOR IRRIGATION IR SOLN
Status: DC | PRN
  Administered 2017-02-09: 09:00:00

## 2017-02-09 MED ORDER — PROPOFOL 10 MG/ML IV BOLUS
INTRAVENOUS | Status: DC | PRN
  Administered 2017-02-09: 20 mg via INTRAVENOUS
  Administered 2017-02-09 (×2): 50 mg via INTRAVENOUS
  Administered 2017-02-09: 30 mg via INTRAVENOUS
  Administered 2017-02-09: 50 mg via INTRAVENOUS
  Administered 2017-02-09 (×3): 20 mg via INTRAVENOUS

## 2017-02-09 MED ORDER — LACTATED RINGERS IV SOLN
INTRAVENOUS | Status: DC
  Administered 2017-02-09: 08:00:00 via INTRAVENOUS

## 2017-02-09 MED ORDER — GLYCOPYRROLATE 0.2 MG/ML IJ SOLN
INTRAMUSCULAR | Status: DC | PRN
  Administered 2017-02-09: 0.2 mg via INTRAVENOUS

## 2017-02-09 SURGICAL SUPPLY — 36 items
BALLN DILATOR 10-12 8 (BALLOONS)
BALLN DILATOR 12-15 8 (BALLOONS)
BALLN DILATOR 15-18 8 (BALLOONS)
BALLN DILATOR CRE 0-12 8 (BALLOONS)
BALLN DILATOR ESOPH 8 10 CRE (MISCELLANEOUS) IMPLANT
BALLOON DILATOR 12-15 8 (BALLOONS) IMPLANT
BALLOON DILATOR 15-18 8 (BALLOONS) IMPLANT
BALLOON DILATOR CRE 0-12 8 (BALLOONS) IMPLANT
BLOCK BITE 60FR ADLT L/F GRN (MISCELLANEOUS) ×3 IMPLANT
CANISTER SUCT 1200ML W/VALVE (MISCELLANEOUS) ×3 IMPLANT
CLIP HMST 235XBRD CATH ROT (MISCELLANEOUS) IMPLANT
CLIP HMST11XOPN 235X2.8X (MISCELLANEOUS) ×1 IMPLANT
CLIP RESOLUTION 360 11X235 (MISCELLANEOUS) ×2
FCP ESCP3.2XJMB 240X2.8X (MISCELLANEOUS)
FORCEPS BIOP RAD 4 LRG CAP 4 (CUTTING FORCEPS) IMPLANT
FORCEPS BIOP RJ4 240 W/NDL (MISCELLANEOUS)
FORCEPS ESCP3.2XJMB 240X2.8X (MISCELLANEOUS) IMPLANT
GOWN CVR UNV OPN BCK APRN NK (MISCELLANEOUS) ×2 IMPLANT
GOWN ISOL THUMB LOOP REG UNIV (MISCELLANEOUS) ×4
INJECTOR VARIJECT VIN23 (MISCELLANEOUS) IMPLANT
KIT DEFENDO VALVE AND CONN (KITS) IMPLANT
KIT ENDO PROCEDURE OLY (KITS) ×3 IMPLANT
MARKER SPOT ENDO TATTOO 5ML (MISCELLANEOUS) IMPLANT
PAD GROUND ADULT SPLIT (MISCELLANEOUS) IMPLANT
PROBE APC STR FIRE (PROBE) IMPLANT
RETRIEVER NET PLAT FOOD (MISCELLANEOUS) IMPLANT
RETRIEVER NET ROTH 2.5X230 LF (MISCELLANEOUS) IMPLANT
SNARE SHORT THROW 13M SML OVAL (MISCELLANEOUS) ×3 IMPLANT
SNARE SHORT THROW 30M LRG OVAL (MISCELLANEOUS) IMPLANT
SNARE SNG USE RND 15MM (INSTRUMENTS) IMPLANT
SPOT EX ENDOSCOPIC TATTOO (MISCELLANEOUS)
SYR INFLATION 60ML (SYRINGE) IMPLANT
TRAP ETRAP POLY (MISCELLANEOUS) IMPLANT
VARIJECT INJECTOR VIN23 (MISCELLANEOUS)
WATER STERILE IRR 250ML POUR (IV SOLUTION) ×3 IMPLANT
WIRE CRE 18-20MM 8CM F G (MISCELLANEOUS) IMPLANT

## 2017-02-09 NOTE — Op Note (Signed)
[facility] Gastroenterology Patient Name: Margaret Potts Procedure Date: 02/09/2017 9:20 AM MRN: [id number] Account #: [id number] Date of Birth: 12/10/1955 Admit Type: Outpatient Age: 61 Room: [facility] OR ROOM 01 Gender: Female Note Status: Finalized Procedure:            Upper GI endoscopy Indications:          Melena Providers:            Jonathon Bellows MD, MD Referring MD:         Leonie Douglas. Doy Hutching, MD (Referring MD) Medicines:            Monitored Anesthesia Care Complications:        No immediate complications. Procedure:            Pre-Anesthesia Assessment:                       - Prior to the procedure, a History and Physical was                        performed, and patient medications, allergies and                        sensitivities were reviewed. The patient's tolerance of                        previous anesthesia was reviewed.                       - The risks and benefits of the procedure and the                        sedation options and risks were discussed with the                        patient. All questions were answered and informed                        consent was obtained.                       - ASA Grade Assessment: III - A patient with severe                        systemic disease.                       After obtaining informed consent, the endoscope was                        passed under direct vision. Throughout the procedure,                        the patient's blood pressure, pulse, and oxygen                        saturations were monitored continuously. The Olympus                        GIF H180J Endoscope (Q#:[id number]) was introduced through  the mouth, and advanced to the third part of duodenum.                        The upper GI endoscopy was accomplished with ease. The                        patient tolerated the procedure well. Findings:      The esophagus was normal.      The stomach was normal.   The examined duodenum was normal. Impression:           - Normal esophagus.                       - Normal stomach.                       - Normal examined duodenum.                       - No specimens collected. Recommendation:       - Perform a colonoscopy today. Procedure Code(s):    --- Professional ---                       [phone], Esophagogastroduodenoscopy, flexible, transoral;                        diagnostic, including collection of specimen(s) by                        brushing or washing, when performed (separate procedure) Diagnosis Code(s):    --- Professional ---                       K92.1, Melena (includes Hematochezia) CPT copyright 2016 American Medical Association. All rights reserved. The codes documented in this report are preliminary and upon coder review may  be revised to meet current compliance requirements. Jonathon Bellows, MD Jonathon Bellows MD, MD 02/09/2017 9:28:08 AM This report has been signed electronically. Number of Addenda: 0 Note Initiated On: 02/09/2017 9:20 AM      [facility]

## 2017-02-09 NOTE — H&P (Signed)
Jonathon Bellows MD [address], [place], [place] [address] Phone: [phone] Fax : [phone]  Primary Care Physician:  Idelle Crouch, MD Primary Gastroenterologist:  Dr. Jonathon Bellows   Pre-Procedure History & Physical: HPI:  Margaret Potts is a 61 y.o. female is here for an endoscopy and colonoscopy.   Past Medical History:  Diagnosis Date  . Arthritis   . Breast cancer ([place])    RT with mastectomy in 1995  . Cognitive deficits   . Depression   . Hyperlipemia   . Hypertension   . Lung cancer ([place])    RT upper lobe removed in OCT 2015  . Lung nodule   . Memory loss   . Seizures ([place])     Past Surgical History:  Procedure Laterality Date  . BREAST BIOPSY Left    benign  . LUNG LOBECTOMY    . MASTECTOMY Right   . right mastectomy      Prior to Admission medications   Medication Sig Start Date End Date Taking? Authorizing Provider  omeprazole (PRILOSEC) 20 MG capsule  01/27/17  Yes [provider]  Ascorbic Acid (VITAMIN C) 1000 MG tablet Take 1,000 mg by mouth daily.    [provider]  benzonatate (TESSALON) 100 MG capsule  12/21/16   [provider]  Misc Natural Products (BEE PROPOLIS PO) Take by mouth.    [provider]  olopatadine (PATANOL) 0.1 % ophthalmic solution Apply to eye. 09/16/16   [provider]  vitamin E 400 UNIT capsule Take by mouth.    [provider]    Allergies as of 02/02/2017  . (No Known Allergies)    Family History  Problem Relation Age of Onset  . Breast cancer Paternal Aunt   . Liver cancer Mother   . Lung cancer Sister   . Liver cancer Brother   . Hematuria Neg Hx   . Renal cancer Neg Hx     Social History   Social History  . Marital status: Widowed    Spouse name: N/A  . Number of children: N/A  . Years of education: N/A   Occupational History  . Not on file.   Social History Main Topics  . Smoking status: Never Smoker  . Smokeless tobacco: Never Used    . Alcohol use Yes     Comment: occassionally  . Drug use: No  . Sexual activity: Yes   Other Topics Concern  . Not on file   Social History Narrative  . No narrative on file    Review of Systems: See HPI, otherwise negative ROS  Physical Exam: BP 133/78   Pulse 64   Temp 97.7 F (36.5 C) (Temporal)   Resp 16   Ht 5' (1.524 m)   Wt 108 lb (49 kg)   SpO2 98%   BMI 21.09 kg/m  General:   Alert,  pleasant and cooperative in NAD Head:  Normocephalic and atraumatic. Neck:  Supple; no masses or thyromegaly. Lungs:  Clear throughout to auscultation.    Heart:  Regular rate and rhythm. Abdomen:  Soft, nontender and nondistended. Normal bowel sounds, without guarding, and without rebound.   Neurologic:  Alert and  oriented x4;  grossly normal neurologically.  Impression/Plan: [name] is here for an endoscopy and colonoscopy to be performed for melena and abnromal thickening of the sigmoid colon seen on CT scan of the abdomen   Risks, benefits, limitations, and alternatives regarding  endoscopy and colonoscopy have been reviewed  with the patient.  Questions have been answered.  All parties agreeable.   Jonathon Bellows, MD  02/09/2017, 8:37 AM

## 2017-02-09 NOTE — Transfer of Care (Signed)
Immediate Anesthesia Transfer of Care Note  Patient: Margaret Potts  Procedure(s) Performed: Procedure(s): COLONOSCOPY WITH PROPOFOL (N/A) ESOPHAGOGASTRODUODENOSCOPY (EGD) (N/A) POLYPECTOMY (N/A)  Patient Location: PACU  Anesthesia Type: General  Level of Consciousness: awake, alert  and patient cooperative  Airway and Oxygen Therapy: Patient Spontanous Breathing and Patient connected to supplemental oxygen  Post-op Assessment: Post-op Vital signs reviewed, Patient's Cardiovascular Status Stable, Respiratory Function Stable, Patent Airway and No signs of Nausea or vomiting  Post-op Vital Signs: Reviewed and stable  Complications: No apparent anesthesia complications

## 2017-02-09 NOTE — Op Note (Signed)
[facility] Gastroenterology Patient Name: Margaret Potts Procedure Date: 02/09/2017 9:21 AM MRN: [id number] Account #: [id number] Date of Birth: 1956/05/28 Admit Type: Outpatient Age: 61 Room: [facility] OR ROOM 01 Gender: Female Note Status: Finalized Procedure:            Colonoscopy Indications:          Melena Providers:            Jonathon Bellows MD, MD Referring MD:         Leonie Douglas. Doy Hutching, MD (Referring MD) Medicines:            Monitored Anesthesia Care Complications:        No immediate complications. Procedure:            Pre-Anesthesia Assessment:                       - Prior to the procedure, a History and Physical was                        performed, and patient medications, allergies and                        sensitivities were reviewed. The patient's tolerance of                        previous anesthesia was reviewed.                       - The risks and benefits of the procedure and the                        sedation options and risks were discussed with the                        patient. All questions were answered and informed                        consent was obtained.                       - ASA Grade Assessment: III - A patient with severe                        systemic disease.                       After obtaining informed consent, the colonoscope was                        passed under direct vision. Throughout the procedure,                        the patient's blood pressure, pulse, and oxygen                        saturations were monitored continuously. The [place] [id number]) was introduced through the                        anus  and advanced to the the cecum, identified by the                        appendiceal orifice, IC valve and transillumination.                        The colonoscopy was performed with ease. The patient                        tolerated the procedure well. The quality of  the bowel                        preparation was good. Findings:      Non-bleeding internal hemorrhoids were found during retroflexion. The       hemorrhoids were small and Grade I (internal hemorrhoids that do not       prolapse).      A 15 mm polyp was found in the sigmoid colon. The polyp was       pedunculated. The polyp was removed with a hot snare. Resection and       retrieval were complete. To prevent bleeding after the polypectomy, one       hemostatic clip was successfully placed. There was no bleeding at the       end of the procedure.      The exam was otherwise without abnormality. Impression:           - Non-bleeding internal hemorrhoids.                       - One 15 mm polyp in the sigmoid colon, removed with a                        hot snare. Resected and retrieved. Clip was placed.                       - The examination was otherwise normal. Recommendation:       - Discharge patient to home (with escort).                       - Resume previous diet.                       - Continue present medications.                       - Await pathology results.                       - Repeat colonoscopy for surveillance based on                        pathology results. Procedure Code(s):    --- Professional ---                       [phone], Colonoscopy, flexible; with removal of tumor(s),                        polyp(s), or other lesion(s) by snare technique Diagnosis Code(s):    --- Professional ---  D12.5, Benign neoplasm of sigmoid colon                       K64.0, First degree hemorrhoids                       K92.1, Melena (includes Hematochezia) CPT copyright 2016 American Medical Association. All rights reserved. The codes documented in this report are preliminary and upon coder review may  be revised to meet current compliance requirements. Jonathon Bellows, MD Jonathon Bellows MD, MD 02/09/2017 10:01:49 AM This report has been signed  electronically. Number of Addenda: 0 Note Initiated On: 02/09/2017 9:21 AM Scope Withdrawal Time: 0 hours 24 minutes 47 seconds  Total Procedure Duration: 0 hours 26 minutes 57 seconds       [facility]

## 2017-02-09 NOTE — Anesthesia Procedure Notes (Signed)
Performed by: Sugar Vanzandt Pre-anesthesia Checklist: Patient identified, Emergency Drugs available, Suction available, Timeout performed and Patient being monitored Patient Re-evaluated:Patient Re-evaluated prior to induction Oxygen Delivery Method: Nasal cannula Placement Confirmation: positive ETCO2       

## 2017-02-09 NOTE — Anesthesia Postprocedure Evaluation (Signed)
Anesthesia Post Note  Patient: Margaret Potts  Procedure(s) Performed: Procedure(s) (LRB): COLONOSCOPY WITH PROPOFOL (N/A) ESOPHAGOGASTRODUODENOSCOPY (EGD) (N/A) POLYPECTOMY (N/A)  Patient location during evaluation: PACU Anesthesia Type: General Level of consciousness: awake and alert, oriented and patient cooperative Pain management: pain level controlled Vital Signs Assessment: post-procedure vital signs reviewed and stable Respiratory status: spontaneous breathing, nonlabored ventilation and respiratory function stable Cardiovascular status: blood pressure returned to baseline and stable Postop Assessment: adequate PO intake Anesthetic complications: no    Margaret Potts

## 2017-02-09 NOTE — Anesthesia Preprocedure Evaluation (Signed)
Anesthesia Evaluation  Patient identified by MRN, date of birth, ID band Patient awake    Reviewed: Allergy & Precautions, NPO status , Patient's Chart, lab work & pertinent test results  History of Anesthesia Complications Negative for: history of anesthetic complications  Airway Mallampati: III  TM Distance: >3 FB Neck ROM: Full    Dental  (+)    Pulmonary  Lung CA s/p resection   Pulmonary exam normal breath sounds clear to auscultation       Cardiovascular Exercise Tolerance: Good negative cardio ROS Normal cardiovascular exam Rhythm:Regular Rate:Normal     Neuro/Psych PSYCHIATRIC DISORDERS Depression negative neurological ROS     GI/Hepatic GERD  Medicated,Rectal bleeding   Endo/Other  negative endocrine ROS  Renal/GU negative Renal ROS     Musculoskeletal  (+) Arthritis , Osteoarthritis,    Abdominal   Peds  Hematology Breast CA s/p surgery with right LN dissection   Anesthesia Other Findings   Reproductive/Obstetrics                             Anesthesia Physical Anesthesia Plan  ASA: III  Anesthesia Plan: General   Post-op Pain Management:    Induction: Intravenous  PONV Risk Score and Plan: 2 and Ondansetron and Propofol infusion  Airway Management Planned: Natural Airway  Additional Equipment:   Intra-op Plan:   Post-operative Plan:   Informed Consent: I have reviewed the patients History and Physical, chart, labs and discussed the procedure including the risks, benefits and alternatives for the proposed anesthesia with the patient or authorized representative who has indicated his/her understanding and acceptance.     Plan Discussed with: CRNA  Anesthesia Plan Comments:         Anesthesia Quick Evaluation

## 2017-02-10 ENCOUNTER — Encounter: Payer: Self-pay | Admitting: Gastroenterology

## 2017-04-27 ENCOUNTER — Other Ambulatory Visit: Payer: Self-pay | Admitting: Internal Medicine

## 2017-04-27 DIAGNOSIS — N644 Mastodynia: Secondary | ICD-10-CM

## 2017-05-23 ENCOUNTER — Ambulatory Visit
Admission: RE | Admit: 2017-05-23 | Discharge: 2017-05-23 | Disposition: A | Discharge status: Home / Self Care | Payer: Federal, State, Local not specified - PPO | Source: Ambulatory Visit | Attending: Internal Medicine | Admitting: Internal Medicine

## 2017-05-23 DIAGNOSIS — N644 Mastodynia: Secondary | ICD-10-CM | POA: Insufficient documentation

## 2017-05-23 DIAGNOSIS — N6323 Unspecified lump in the left breast, lower outer quadrant: Secondary | ICD-10-CM | POA: Insufficient documentation

## 2017-05-24 ENCOUNTER — Other Ambulatory Visit: Payer: Self-pay | Admitting: Internal Medicine

## 2017-05-24 DIAGNOSIS — N6320 Unspecified lump in the left breast, unspecified quadrant: Secondary | ICD-10-CM

## 2017-05-24 DIAGNOSIS — R928 Other abnormal and inconclusive findings on diagnostic imaging of breast: Secondary | ICD-10-CM

## 2017-05-27 ENCOUNTER — Ambulatory Visit
Admission: RE | Admit: 2017-05-27 | Discharge: 2017-05-27 | Disposition: A | Discharge status: Home / Self Care | Payer: Federal, State, Local not specified - PPO | Source: Ambulatory Visit | Attending: Internal Medicine | Admitting: Internal Medicine

## 2017-05-27 DIAGNOSIS — N6032 Fibrosclerosis of left breast: Secondary | ICD-10-CM | POA: Insufficient documentation

## 2017-05-27 DIAGNOSIS — N6320 Unspecified lump in the left breast, unspecified quadrant: Secondary | ICD-10-CM

## 2017-05-27 DIAGNOSIS — R928 Other abnormal and inconclusive findings on diagnostic imaging of breast: Secondary | ICD-10-CM

## 2017-05-27 DIAGNOSIS — N6321 Unspecified lump in the left breast, upper outer quadrant: Secondary | ICD-10-CM | POA: Diagnosis present

## 2017-05-27 HISTORY — PX: BREAST BIOPSY: SHX20

## 2017-05-30 LAB — SURGICAL PATHOLOGY

## 2017-06-28 ENCOUNTER — Ambulatory Visit: Admit: 2017-06-28 | Payer: Federal, State, Local not specified - PPO | Admitting: Gastroenterology

## 2017-06-28 SURGERY — ESOPHAGOGASTRODUODENOSCOPY (EGD) WITH PROPOFOL
Anesthesia: General

## 2017-09-15 ENCOUNTER — Encounter: Payer: Self-pay | Admitting: Urology

## 2017-09-15 ENCOUNTER — Ambulatory Visit: Payer: Federal, State, Local not specified - PPO | Admitting: Urology

## 2018-02-03 ENCOUNTER — Other Ambulatory Visit: Payer: Self-pay | Admitting: Internal Medicine

## 2018-02-03 DIAGNOSIS — C3411 Malignant neoplasm of upper lobe, right bronchus or lung: Secondary | ICD-10-CM

## 2018-02-06 ENCOUNTER — Ambulatory Visit
Admission: RE | Admit: 2018-02-06 | Discharge: 2018-02-06 | Disposition: A | Discharge status: Home / Self Care | Payer: Federal, State, Local not specified - PPO | Source: Ambulatory Visit | Attending: Internal Medicine | Admitting: Internal Medicine

## 2018-02-06 DIAGNOSIS — C3411 Malignant neoplasm of upper lobe, right bronchus or lung: Secondary | ICD-10-CM | POA: Insufficient documentation

## 2019-02-20 ENCOUNTER — Encounter: Payer: Self-pay | Admitting: Occupational Therapy

## 2019-02-20 ENCOUNTER — Other Ambulatory Visit: Payer: Self-pay

## 2019-02-20 ENCOUNTER — Ambulatory Visit: Payer: Federal, State, Local not specified - PPO | Attending: Orthopedic Surgery | Admitting: Occupational Therapy

## 2019-02-20 DIAGNOSIS — M25641 Stiffness of right hand, not elsewhere classified: Secondary | ICD-10-CM | POA: Diagnosis present

## 2019-02-20 DIAGNOSIS — M6281 Muscle weakness (generalized): Secondary | ICD-10-CM

## 2019-02-20 DIAGNOSIS — M654 Radial styloid tenosynovitis [de Quervain]: Secondary | ICD-10-CM

## 2019-02-20 DIAGNOSIS — M79641 Pain in right hand: Secondary | ICD-10-CM | POA: Diagnosis present

## 2019-02-20 DIAGNOSIS — M25531 Pain in right wrist: Secondary | ICD-10-CM | POA: Diagnosis present

## 2019-02-20 DIAGNOSIS — M25631 Stiffness of right wrist, not elsewhere classified: Secondary | ICD-10-CM

## 2019-02-20 NOTE — Therapy (Signed)
[facility] [address], [place], [address] Phone: [phone]   Fax:  [phone]  Occupational Therapy Evaluation  Patient Details  Name: Margaret Potts MRN: [id number] Date of Birth: 30-Jul-1955 Referring Provider (OT): Arvella Nigh   Encounter Date: 02/20/2019  OT End of Session - 02/20/19 1531    Visit Number  1    Number of Visits  10    Date for OT Re-Evaluation  03/28/19    OT Start Time  1435    OT Stop Time  1535    OT Time Calculation (min)  60 min    Activity Tolerance  Patient tolerated treatment well    Behavior During Therapy  [facility] for tasks assessed/performed       Past Medical History:  Diagnosis Date  . Arthritis   . Breast cancer ([place])    RT with mastectomy in 1995  . Cognitive deficits   . Depression   . Hyperlipemia   . Hypertension   . Lung cancer ([place])    RT upper lobe removed in OCT 2015  . Lung nodule   . Memory loss   . Seizures ([place])     Past Surgical History:  Procedure Laterality Date  . BREAST BIOPSY Left    benign  . BREAST BIOPSY Left 05/27/2017   [place] core ribbon REACTIVE FIBROSIS CONSISTENT WITH SCAR.   Marland Kitchen COLONOSCOPY WITH PROPOFOL N/A 02/09/2017   Procedure: COLONOSCOPY WITH PROPOFOL;  Surgeon: Jonathon Bellows, MD;  Location: [facility];  Service: Gastroenterology;  Laterality: N/A;  . ESOPHAGOGASTRODUODENOSCOPY N/A 02/09/2017   Procedure: ESOPHAGOGASTRODUODENOSCOPY (EGD);  Surgeon: Jonathon Bellows, MD;  Location: [facility];  Service: Gastroenterology;  Laterality: N/A;  . LUNG LOBECTOMY    . MASTECTOMY Right   . POLYPECTOMY N/A 02/09/2017   Procedure: POLYPECTOMY;  Surgeon: Jonathon Bellows, MD;  Location: [facility];  Service: Gastroenterology;  Laterality: N/A;  . right mastectomy      There were no vitals filed for this visit.  Subjective Assessment - 02/20/19 1519    Subjective   My pain is bad - I cannot do anything with my R hand - and I am R  hand dominant- I am on light duty now    Pertinent History  I had my symptoms slowly coming on from January - but gradually pain worsen the last 9 wks - work typing a lot and packages - and love gardening - my first  2 splints was not working - they were to [employer] - just got one yesterday that fits better    Patient Stated Goals  I want my hand and wrist to get better - so I can play gold, garden , working -   I want to retire with not pain down the road    Currently in Pain?  Yes    Pain Score  9     Pain Location  Wrist    Pain Orientation  Right    Pain Descriptors / Indicators  Shooting;Sharp;Tender    Pain Type  Acute pain    Pain Onset  More than a month ago    Pain Frequency  Constant        [facility] OT Assessment - 02/20/19 0001      Assessment   Medical Diagnosis  R deQuervain     Referring Provider (OT)  Arvella Nigh    Onset Date/Surgical Date  08/12/18    Hand Dominance  Right  Next MD Visit  --   27th of Aug     Home  Environment   Lives With  Alone      Prior Function   Vocation  Full time employment    Leisure  golf, garden and travel       AROM   Right Wrist Extension  35 Degrees    Right Wrist Flexion  73 Degrees    Right Wrist Radial Deviation  15 Degrees    Right Wrist Ulnar Deviation  15 Degrees    Left Wrist Extension  74 Degrees    Left Wrist Flexion  90 Degrees    Left Wrist Radial Deviation  32 Degrees    Left Wrist Ulnar Deviation  30 Degrees      Strength   Right Hand Grip (lbs)  12    Right Hand Lateral Pinch  7 lbs    Right Hand 3 Point Pinch  8 lbs    Left Hand Grip (lbs)  45    Left Hand Lateral Pinch  11 lbs    Left Hand 3 Point Pinch  12 lbs      Right Hand AROM   R Thumb Radial ABduction/ADduction 0-55  40    R Thumb Palmar ABduction/ADduction 0-45  42    R Thumb Opposition to Index  --   to 5th with ligth pull        Contrast  Done in clinic this date pt to do  2- 3 x day - several time during day   Pain free  Tendon  glides Opposition to all digits  Wrist flexion , ext, RD, UD   Wear thumb spica - most all the time - off for ADL's and durin  Ionto done this date with dexamethazone for med patch at 1.8 current over R 1st dorsal compartment  wrist - tolerate well - no issue prior to afterwards               OT Education - 02/20/19 1530    Education Details  findings and HEP    Person(s) Educated  Patient    Methods  Explanation;Demonstration;Tactile cues;Verbal cues;Handout    Comprehension  Returned demonstration;Verbalized understanding;Verbal cues required       OT Short Term Goals - 02/20/19 1659      OT SHORT TERM GOAL #1   Title  Pt to be independent in HEP to wear splint , do HEP and modify using her hand to decrease pain    Baseline  little knowledge , splint up to 2 days ago did not fit well -and pain 10/10    Time  3    Period  Weeks    Status  New    Target Date  03/13/19      OT SHORT TERM GOAL #2   Title  Pain on PRWHE improve with more than 20 points    Baseline  Eval pain on PRWHE 47/50    Time  4    Period  Weeks    Status  New    Target Date  03/20/19        OT Long Term Goals - 02/20/19 1700      OT LONG TERM GOAL #1   Title  Pain decrease for pt to wean out of splint more than 75% of time    Baseline  splint 95% of time - pain 10/10 , edema and decrease AROM wrist and thumb    Time  5  Period  Weeks    Status  New    Target Date  03/27/19      OT LONG TERM GOAL #2   Title  R wrist and thumb AROM improve to WNL without increase symptoms    Baseline  decrease wrist and thumb AROM - pain 10/10    Time  5    Period  Weeks    Status  New    Target Date  03/27/19            Plan - 02/20/19 1655    Clinical Impression Statement  Pt present at OT eval with diagnosis of R DeQuervain - pt pain has always pain and increase to 10/10 and tender - pt had symptoms since May but started in [place] - pt show decrease thumb AROM , wrist ROM in all planes  with increase pain and decrease strength limiting her functional use of R dominant hand and wrist in ADL's and IADL's    OT Occupational Profile and History  Problem Focused Assessment - Including review of records relating to presenting problem    Occupational performance deficits (Please refer to evaluation for details):  ADL's;IADL's;Leisure;Work;Play;Social Participation    Body Structure / Function / Physical Skills  ADL;Flexibility;ROM;UE functional use;Dexterity;Edema;Pain;Strength;IADL    Rehab Potential  Fair    Clinical Decision Making  Limited treatment options, no task modification necessary    Comorbidities Affecting Occupational Performance:  None    Modification or Assistance to Complete Evaluation   No modification of tasks or assist necessary to complete eval    OT Frequency  2x / week    OT Duration  --   5 wks   OT Treatment/Interventions  Self-care/ADL training;Iontophoresis;Therapeutic exercise;Patient/family education;Splinting;Fluidtherapy;Contrast Bath;Manual Therapy    Plan  assess progress with HEP and adjust as needed    OT Home Exercise Plan  see pt instruction    Consulted and Agree with Plan of Care  Patient       Patient will benefit from skilled therapeutic intervention in order to improve the following deficits and impairments:   Body Structure / Function / Physical Skills: ADL, Flexibility, ROM, UE functional use, Dexterity, Edema, Pain, Strength, IADL       Visit Diagnosis: 1. Radial styloid tenosynovitis   2. Pain in right hand   3. Pain in right wrist   4. Stiffness of right wrist, not elsewhere classified   5. Stiffness of right hand, not elsewhere classified   6. Muscle weakness (generalized)       Problem List Patient Active Problem List   Diagnosis Date Noted  . Breast cancer ([place]) 02/02/2017  . Environmental allergies 02/02/2017  . Menopausal syndrome 02/02/2017  . Chronic left hip pain 12/28/2016  . Left upper arm pain 12/28/2016   . Right foot pain 10/26/2016  . Greater trochanteric bursitis of left hip 08/03/2016  . Bilateral hand pain 04/27/2016  . Hyperlipemia   . Cognitive deficits   . Memory loss   . Lung nodule   . Malignant neoplasm of upper lobe of right lung ([place]) 05/27/2014    Brittane Grudzinski OTR/l,CLT 02/20/2019, 5:05 PM  [facility] [address], [place], [address] Phone: [phone]   Fax:  [phone]  Name: Margaret Potts MRN: [id number] Date of Birth: 1956/03/11

## 2019-02-20 NOTE — Patient Instructions (Signed)
Contrast  2- 3 x day - several time during day   Pain free  Tendon glides Opposition to all digits  Wrist flexion , ext, RD, UD   Wear thumb spica - most all the time - off for ADL's and during HEP

## 2019-02-27 ENCOUNTER — Ambulatory Visit: Payer: Federal, State, Local not specified - PPO | Admitting: Occupational Therapy

## 2019-02-27 ENCOUNTER — Other Ambulatory Visit: Payer: Self-pay

## 2019-02-27 DIAGNOSIS — M25641 Stiffness of right hand, not elsewhere classified: Secondary | ICD-10-CM

## 2019-02-27 DIAGNOSIS — M654 Radial styloid tenosynovitis [de Quervain]: Secondary | ICD-10-CM | POA: Diagnosis not present

## 2019-02-27 DIAGNOSIS — M25531 Pain in right wrist: Secondary | ICD-10-CM

## 2019-02-27 DIAGNOSIS — M79641 Pain in right hand: Secondary | ICD-10-CM

## 2019-02-27 DIAGNOSIS — M25631 Stiffness of right wrist, not elsewhere classified: Secondary | ICD-10-CM

## 2019-02-27 DIAGNOSIS — M6281 Muscle weakness (generalized): Secondary | ICD-10-CM

## 2019-02-27 NOTE — Patient Instructions (Signed)
Same HEP -but can do some soft tissue massage to webspace

## 2019-02-27 NOTE — Therapy (Signed)
[facility] [address] [place], [address] Phone: [phone]   Fax:  [phone]  Occupational Therapy Treatment  Patient Details  Name: Margaret Potts MRN: [id number] Date of Birth: [birth year] Referring Provider (OT): Arvella Nigh   Encounter Date: 02/27/2019  OT End of Session - 02/27/19 1351    Visit Number  2    Number of Visits  10    Date for OT Re-Evaluation  03/28/19    OT Start Time  1342    OT Stop Time  1428    OT Time Calculation (min)  46 min    Activity Tolerance  Patient tolerated treatment well    Behavior During Therapy  [facility] for tasks assessed/performed       Past Medical History:  Diagnosis Date  . Arthritis   . Breast cancer ([place])    RT with mastectomy in 1995  . Cognitive deficits   . Depression   . Hyperlipemia   . Hypertension   . Lung cancer ([place])    RT upper lobe removed in OCT 2015  . Lung nodule   . Memory loss   . Seizures ([place])     Past Surgical History:  Procedure Laterality Date  . BREAST BIOPSY Left    benign  . BREAST BIOPSY Left 05/27/2017   [place] core ribbon REACTIVE FIBROSIS CONSISTENT WITH SCAR.   Marland Kitchen COLONOSCOPY WITH PROPOFOL N/A 02/09/2017   Procedure: COLONOSCOPY WITH PROPOFOL;  Surgeon: Jonathon Bellows, MD;  Location: [facility];  Service: Gastroenterology;  Laterality: N/A;  . ESOPHAGOGASTRODUODENOSCOPY N/A 02/09/2017   Procedure: ESOPHAGOGASTRODUODENOSCOPY (EGD);  Surgeon: Jonathon Bellows, MD;  Location: [facility];  Service: Gastroenterology;  Laterality: N/A;  . LUNG LOBECTOMY    . MASTECTOMY Right   . POLYPECTOMY N/A 02/09/2017   Procedure: POLYPECTOMY;  Surgeon: Jonathon Bellows, MD;  Location: [facility];  Service: Gastroenterology;  Laterality: N/A;  . right mastectomy      There were no vitals filed for this visit.  Subjective Assessment - 02/27/19 1347    Subjective   Pain is little better - still wrist motion not good - but I can tell  it is getting better I can touch my arm now and wash it    Pertinent History  I had my symptoms slowly coming on from January - but gradually pain worsen the last 9 wks - work typing a lot and packages - and love gardening - my first  2 splints was not working - they were to [employer] - just got one yesterday that fits better    Patient Stated Goals  I want my hand and wrist to get better - so I can play gold, garden , working -   I want to retire with not pain down the road    Currently in Pain?  Yes    Pain Score  7     Pain Location  Wrist    Pain Orientation  Right    Pain Descriptors / Indicators  Aching;Shooting;Sharp        AROM about the same but pain decrease from last time 10/10 at the worse to 7/10  Still tender but able to wash her arm and hand now  AROM for PA and RA decrease - tight in webspace pt can do some soft tissue mobs to webspace after contrast prior to her AROM pain free range  Splint still on at all times -expect ADl's and HEP  OT Treatments/Exercises (OP) - 02/27/19 0001      Iontophoresis   Type of Iontophoresis  Dexamethasone    Location  1st dorsal compartment R     Dose  med patch       RUE Contrast Bath   Time  9 minutes    Comments  prior to AROM and soft tissue      soft tissue mobs using graston tool nr 2 on sweeping forearm - pain free  And webspace soft tissue mobs  with gentle pain free AAROM for wrist and thumb in all  Planes  Skin check done prior to ionto -tolerate well at 2.0 current - 19 min  Pt to take patch off in hour        OT Education - 02/27/19 1351    Education Details  progress and HEP    Person(s) Educated  Patient    Methods  Explanation;Demonstration;Tactile cues;Verbal cues;Handout    Comprehension  Returned demonstration;Verbalized understanding;Verbal cues required       OT Short Term Goals - 02/20/19 1659      OT SHORT TERM GOAL #1   Title  Pt to be independent in HEP to wear splint , do HEP and  modify using her hand to decrease pain    Baseline  little knowledge , splint up to 2 days ago did not fit well -and pain 10/10    Time  3    Period  Weeks    Status  New    Target Date  03/13/19      OT SHORT TERM GOAL #2   Title  Pain on PRWHE improve with more than 20 points    Baseline  Eval pain on PRWHE 47/50    Time  4    Period  Weeks    Status  New    Target Date  03/20/19        OT Long Term Goals - 02/20/19 1700      OT LONG TERM GOAL #1   Title  Pain decrease for pt to wean out of splint more than 75% of time    Baseline  splint 95% of time - pain 10/10 , edema and decrease AROM wrist and thumb    Time  5    Period  Weeks    Status  New    Target Date  03/27/19      OT LONG TERM GOAL #2   Title  R wrist and thumb AROM improve to WNL without increase symptoms    Baseline  decrease wrist and thumb AROM - pain 10/10    Time  5    Period  Weeks    Status  New    Target Date  03/27/19            Plan - 02/27/19 1351    Clinical Impression Statement  Pt show this date decrease pain -still tender and more than 7/10 but can tolerate washing her hand now - was last time 10/10 - pt to cont to wear thumb spica most all the time -and pain free AROM - she can do some webpace soft tissue massge - done this date 2nd session of ionto    OT Occupational Profile and History  Problem Focused Assessment - Including review of records relating to presenting problem    Occupational performance deficits (Please refer to evaluation for details):  ADL's;IADL's;Leisure;Work;Play;Social Participation    Body Structure / Function / Physical Skills  ADL;Flexibility;ROM;UE functional  use;Dexterity;Edema;Pain;Strength;IADL    Rehab Potential  Fair    Clinical Decision Making  Limited treatment options, no task modification necessary    Comorbidities Affecting Occupational Performance:  None    Modification or Assistance to Complete Evaluation   No modification of tasks or assist  necessary to complete eval    OT Frequency  2x / week    OT Duration  4 weeks    OT Treatment/Interventions  Self-care/ADL training;Iontophoresis;Therapeutic exercise;Patient/family education;Splinting;Fluidtherapy;Contrast Bath;Manual Therapy    Plan  assess progress with HEP and adjust as needed    OT Home Exercise Plan  see pt instruction    Consulted and Agree with Plan of Care  Patient       Patient will benefit from skilled therapeutic intervention in order to improve the following deficits and impairments:   Body Structure / Function / Physical Skills: ADL, Flexibility, ROM, UE functional use, Dexterity, Edema, Pain, Strength, IADL       Visit Diagnosis: 1. Radial styloid tenosynovitis   2. Pain in right hand   3. Pain in right wrist   4. Stiffness of right wrist, not elsewhere classified   5. Stiffness of right hand, not elsewhere classified   6. Muscle weakness (generalized)       Problem List Patient Active Problem List   Diagnosis Date Noted  . Breast cancer ([place]) 02/02/2017  . Environmental allergies 02/02/2017  . Menopausal syndrome 02/02/2017  . Chronic left hip pain 12/28/2016  . Left upper arm pain 12/28/2016  . Right foot pain 10/26/2016  . Greater trochanteric bursitis of left hip 08/03/2016  . Bilateral hand pain 04/27/2016  . Hyperlipemia   . Cognitive deficits   . Memory loss   . Lung nodule   . Malignant neoplasm of upper lobe of right lung ([place]) 05/27/2014    Rosalyn Gess OTR/L,CLT 02/27/2019, 2:33 PM  [facility] [address], [place], [address] Phone: [phone]   Fax:  [phone]  Name: Margaret Potts MRN: [id number] Date of Birth: 1955/12/03

## 2019-03-01 ENCOUNTER — Ambulatory Visit: Payer: Federal, State, Local not specified - PPO | Admitting: Occupational Therapy

## 2019-03-01 ENCOUNTER — Other Ambulatory Visit: Payer: Self-pay

## 2019-03-01 DIAGNOSIS — M25641 Stiffness of right hand, not elsewhere classified: Secondary | ICD-10-CM

## 2019-03-01 DIAGNOSIS — M25531 Pain in right wrist: Secondary | ICD-10-CM

## 2019-03-01 DIAGNOSIS — M6281 Muscle weakness (generalized): Secondary | ICD-10-CM

## 2019-03-01 DIAGNOSIS — M654 Radial styloid tenosynovitis [de Quervain]: Secondary | ICD-10-CM

## 2019-03-01 DIAGNOSIS — M25631 Stiffness of right wrist, not elsewhere classified: Secondary | ICD-10-CM

## 2019-03-01 DIAGNOSIS — M79641 Pain in right hand: Secondary | ICD-10-CM

## 2019-03-01 NOTE — Therapy (Signed)
[facility] [address] [place], [address] Phone: [phone]   Fax:  [phone]  Occupational Therapy Treatment  Patient Details  Name: Margaret Potts MRN: [id number] Date of Birth: 12/13/1955 Referring Provider (OT): Arvella Nigh   Encounter Date: 03/01/2019  OT End of Session - 03/01/19 1032    Visit Number  3    Number of Visits  10    Date for OT Re-Evaluation  03/28/19    OT Start Time  0930    OT Stop Time  1023    OT Time Calculation (min)  53 min    Activity Tolerance  Patient tolerated treatment well    Behavior During Therapy  [facility] for tasks assessed/performed       Past Medical History:  Diagnosis Date  . Arthritis   . Breast cancer ([place])    RT with mastectomy in 1995  . Cognitive deficits   . Depression   . Hyperlipemia   . Hypertension   . Lung cancer ([place])    RT upper lobe removed in OCT 2015  . Lung nodule   . Memory loss   . Seizures ([place])     Past Surgical History:  Procedure Laterality Date  . BREAST BIOPSY Left    benign  . BREAST BIOPSY Left 05/27/2017   [place] core ribbon REACTIVE FIBROSIS CONSISTENT WITH SCAR.   Marland Kitchen COLONOSCOPY WITH PROPOFOL N/A 02/09/2017   Procedure: COLONOSCOPY WITH PROPOFOL;  Surgeon: Jonathon Bellows, MD;  Location: [place];  Service: Gastroenterology;  Laterality: N/A;  . ESOPHAGOGASTRODUODENOSCOPY N/A 02/09/2017   Procedure: ESOPHAGOGASTRODUODENOSCOPY (EGD);  Surgeon: Jonathon Bellows, MD;  Location: [place];  Service: Gastroenterology;  Laterality: N/A;  . LUNG LOBECTOMY    . MASTECTOMY Right   . POLYPECTOMY N/A 02/09/2017   Procedure: POLYPECTOMY;  Surgeon: Jonathon Bellows, MD;  Location: [place];  Service: Gastroenterology;  Laterality: N/A;  . right mastectomy      There were no vitals filed for this visit.  Subjective Assessment - 03/01/19 0936    Subjective   Pain is getting - about 4/10 today - not as tender when I bath     Pertinent History  I had my symptoms slowly coming on from January - but gradually pain worsen the last 9 wks - work typing a lot and packages - and love gardening - my first  2 splints was not working - they were to [employer] - just got one yesterday that fits better    Patient Stated Goals  I want my hand and wrist to get better - so I can play gold, garden , working -   I want to retire with not pain down the road    Currently in Pain?  Yes    Pain Score  4     Pain Location  Wrist    Pain Orientation  Right    Pain Descriptors / Indicators  Aching;Tender    Pain Type  Acute pain    Pain Onset  More than a month ago    Pain Frequency  Constant           AROM about the same but pain decrease from last time 10/10 at the worse to 4/10  Still tender but able to wash her arm and hand now  AROM for PA and RA decrease - tight in webspace pt can do some soft tissue mobs to webspace after contrast prior to her AROM  pain free range  Splint still on at all times -expect ADl's and HEP         skin check done prior - pt initially more tender - but able to increase to 2.0 current later - pt to keep on for about hour afterwards - pt done last time same  OT Treatments/Exercises (OP) - 03/01/19 0001      Iontophoresis   Type of Iontophoresis  Dexamethasone    Location  1st dorsal compartment R  - 1.0 current and later able to increase to 2.0     Dose  med patch     Time  29      RUE Fluidotherapy   Number Minutes Fluidotherapy  10 Minutes    RUE Fluidotherapy Location  Hand;Wrist    Comments  AROM for writ and thumb - but ice one time inbetween         soft tissue mobs - manual by OT for webspace and CT /MC spreads with some gentle thumb PA and RA  Better after heat  with gentle pain free AAROM for wrist and thumb in all planes by OT      OT Education - 03/01/19 1032    Education Details  progress and HEP - and ionto    Person(s) Educated  Patient    Methods   Explanation;Demonstration;Tactile cues;Verbal cues;Handout    Comprehension  Returned demonstration;Verbalized understanding;Verbal cues required       OT Short Term Goals - 02/20/19 1659      OT SHORT TERM GOAL #1   Title  Pt to be independent in HEP to wear splint , do HEP and modify using her hand to decrease pain    Baseline  little knowledge , splint up to 2 days ago did not fit well -and pain 10/10    Time  3    Period  Weeks    Status  New    Target Date  03/13/19      OT SHORT TERM GOAL #2   Title  Pain on PRWHE improve with more than 20 points    Baseline  Eval pain on PRWHE 47/50    Time  4    Period  Weeks    Status  New    Target Date  03/20/19        OT Long Term Goals - 02/20/19 1700      OT LONG TERM GOAL #1   Title  Pain decrease for pt to wean out of splint more than 75% of time    Baseline  splint 95% of time - pain 10/10 , edema and decrease AROM wrist and thumb    Time  5    Period  Weeks    Status  New    Target Date  03/27/19      OT LONG TERM GOAL #2   Title  R wrist and thumb AROM improve to WNL without increase symptoms    Baseline  decrease wrist and thumb AROM - pain 10/10    Time  5    Period  Weeks    Status  New    Target Date  03/27/19            Plan - 03/01/19 1033    Clinical Impression Statement  Pt show decrease pain and tenderness- pain coming in decrease to 4/10 from 10/10 at eval - but off cause some increase stiffness in thumb and wrist motion - pt to do pain  free AROM after contrast at home - and cont to wear thumb spica    OT Occupational Profile and History  Problem Focused Assessment - Including review of records relating to presenting problem    Occupational performance deficits (Please refer to evaluation for details):  ADL's;IADL's;Leisure;Work;Play;Social Participation    Body Structure / Function / Physical Skills  ADL;Flexibility;ROM;UE functional use;Dexterity;Edema;Pain;Strength;IADL    Rehab Potential  Fair     Clinical Decision Making  Limited treatment options, no task modification necessary    Comorbidities Affecting Occupational Performance:  None    Modification or Assistance to Complete Evaluation   No modification of tasks or assist necessary to complete eval    OT Frequency  2x / week    OT Duration  4 weeks    OT Treatment/Interventions  Self-care/ADL training;Iontophoresis;Therapeutic exercise;Patient/family education;Splinting;Fluidtherapy;Contrast Bath;Manual Therapy    Plan  assess progress with HEP and adjust as needed    OT Home Exercise Plan  see pt instruction    Consulted and Agree with Plan of Care  Patient       Patient will benefit from skilled therapeutic intervention in order to improve the following deficits and impairments:   Body Structure / Function / Physical Skills: ADL, Flexibility, ROM, UE functional use, Dexterity, Edema, Pain, Strength, IADL       Visit Diagnosis: Radial styloid tenosynovitis  Pain in right hand  Pain in right wrist  Stiffness of right wrist, not elsewhere classified  Stiffness of right hand, not elsewhere classified  Muscle weakness (generalized)    Problem List Patient Active Problem List   Diagnosis Date Noted  . Breast cancer ([place]) 02/02/2017  . Environmental allergies 02/02/2017  . Menopausal syndrome 02/02/2017  . Chronic left hip pain 12/28/2016  . Left upper arm pain 12/28/2016  . Right foot pain 10/26/2016  . Greater trochanteric bursitis of left hip 08/03/2016  . Bilateral hand pain 04/27/2016  . Hyperlipemia   . Cognitive deficits   . Memory loss   . Lung nodule   . Malignant neoplasm of upper lobe of right lung ([place]) 05/27/2014    Rosalyn Gess OTR/L,CLT 03/01/2019, 10:35 AM  [facility] [facility] [address], [place], [address] Phone: [phone]   Fax:  [phone]  Name: Margaret Potts MRN: [id number] Date of Birth: [birth year]

## 2019-03-01 NOTE — Patient Instructions (Signed)
Same

## 2019-03-06 ENCOUNTER — Other Ambulatory Visit: Payer: Self-pay

## 2019-03-06 ENCOUNTER — Ambulatory Visit: Payer: Federal, State, Local not specified - PPO | Admitting: Occupational Therapy

## 2019-03-06 DIAGNOSIS — M6281 Muscle weakness (generalized): Secondary | ICD-10-CM

## 2019-03-06 DIAGNOSIS — M25641 Stiffness of right hand, not elsewhere classified: Secondary | ICD-10-CM

## 2019-03-06 DIAGNOSIS — M25631 Stiffness of right wrist, not elsewhere classified: Secondary | ICD-10-CM

## 2019-03-06 DIAGNOSIS — M79641 Pain in right hand: Secondary | ICD-10-CM

## 2019-03-06 DIAGNOSIS — M654 Radial styloid tenosynovitis [de Quervain]: Secondary | ICD-10-CM | POA: Diagnosis not present

## 2019-03-06 DIAGNOSIS — M25531 Pain in right wrist: Secondary | ICD-10-CM

## 2019-03-06 NOTE — Therapy (Signed)
[place] [facility] [address], [place], [address] Phone: [phone]   Fax:  [phone]  Occupational Therapy Treatment  Patient Details  Name: Margaret Potts MRN: [id number] Date of Birth: 1956-05-19 Referring Provider (OT): Arvella Nigh   Encounter Date: 03/06/2019  OT End of Session - 03/06/19 1700    Visit Number  4    Number of Visits  10    Date for OT Re-Evaluation  03/28/19    OT Start Time  1310    OT Stop Time  1403    OT Time Calculation (min)  53 min    Activity Tolerance  Patient tolerated treatment well    Behavior During Therapy  [facility] for tasks assessed/performed       Past Medical History:  Diagnosis Date  . Arthritis   . Breast cancer ([place])    RT with mastectomy in 1995  . Cognitive deficits   . Depression   . Hyperlipemia   . Hypertension   . Lung cancer ([place])    RT upper lobe removed in OCT 2015  . Lung nodule   . Memory loss   . Seizures ([place])     Past Surgical History:  Procedure Laterality Date  . BREAST BIOPSY Left    benign  . BREAST BIOPSY Left 05/27/2017   [place] core ribbon REACTIVE FIBROSIS CONSISTENT WITH SCAR.   Marland Kitchen COLONOSCOPY WITH PROPOFOL N/A 02/09/2017   Procedure: COLONOSCOPY WITH PROPOFOL;  Surgeon: Jonathon Bellows, MD;  Location: [facility];  Service: Gastroenterology;  Laterality: N/A;  . ESOPHAGOGASTRODUODENOSCOPY N/A 02/09/2017   Procedure: ESOPHAGOGASTRODUODENOSCOPY (EGD);  Surgeon: Jonathon Bellows, MD;  Location: [place];  Service: Gastroenterology;  Laterality: N/A;  . LUNG LOBECTOMY    . MASTECTOMY Right   . POLYPECTOMY N/A 02/09/2017   Procedure: POLYPECTOMY;  Surgeon: Jonathon Bellows, MD;  Location: [place];  Service: Gastroenterology;  Laterality: N/A;  . right mastectomy      There were no vitals filed for this visit.  Subjective Assessment - 03/06/19 1658    Subjective   Still pain - this take slow - my splint bothers me at night time and  during the day over that bone on my wrist    Pertinent History  I had my symptoms slowly coming on from January - but gradually pain worsen the last 9 wks - work typing a lot and packages - and love gardening - my first  2 splints was not working - they were to [employer] - just got one yesterday that fits better    Patient Stated Goals  I want my hand and wrist to get better - so I can play gold, garden , working -   I want to retire with not pain down the road    Currently in Pain?  Yes    Pain Score  5     Pain Location  Wrist    Pain Orientation  Right    Pain Descriptors / Indicators  Aching;Tender    Pain Type  Acute pain    Pain Onset  More than a month ago    Pain Frequency  Constant         AROM about the same but pain decrease from from eval  10/10 at the worse to 5/10  This date  Still tender but able to wash her arm and hand now - tenderness more this date - pt report prefab thumb spica bothers her  AROM for PA and RA decrease - tightness improve - pt has been doing soft tissue massage - pt to add AROM pain free PA of thumb to HEP   Fabricated this date thumb spica - to cont to wear  Splint still on at all times -expect ADl's and HEP - can take off when sitting watching tv         skin check done prior -   no issues this date         OT Treatments/Exercises (OP) - 03/06/19 0001      Iontophoresis   Type of Iontophoresis  Dexamethasone    Location  1st dorsal compartment R  - 2.0 current     Dose  med patch     Time  19      RUE Contrast Bath   Time  9 minutes    Comments  prior to AROM and soft tissue        Pt to do AAROM for RD, UD pain free and cont opposition and wrist flexion , ext Add PA of thumb pain free AROM       OT Education - 03/06/19 1659    Education Details  fabricate custom thumb spica - wearing and HEP update    Person(s) Educated  Patient    Methods  Explanation;Demonstration;Tactile cues;Verbal cues;Handout    Comprehension   Returned demonstration;Verbalized understanding;Verbal cues required       OT Short Term Goals - 02/20/19 1659      OT SHORT TERM GOAL #1   Title  Pt to be independent in HEP to wear splint , do HEP and modify using her hand to decrease pain    Baseline  little knowledge , splint up to 2 days ago did not fit well -and pain 10/10    Time  3    Period  Weeks    Status  New    Target Date  03/13/19      OT SHORT TERM GOAL #2   Title  Pain on PRWHE improve with more than 20 points    Baseline  Eval pain on PRWHE 47/50    Time  4    Period  Weeks    Status  New    Target Date  03/20/19        OT Long Term Goals - 02/20/19 1700      OT LONG TERM GOAL #1   Title  Pain decrease for pt to wean out of splint more than 75% of time    Baseline  splint 95% of time - pain 10/10 , edema and decrease AROM wrist and thumb    Time  5    Period  Weeks    Status  New    Target Date  03/27/19      OT LONG TERM GOAL #2   Title  R wrist and thumb AROM improve to WNL without increase symptoms    Baseline  decrease wrist and thumb AROM - pain 10/10    Time  5    Period  Weeks    Status  New    Target Date  03/27/19            Plan - 03/06/19 1700    Clinical Impression Statement  Pt cont to have pain and tenderness over R 1st dorsal compartment - pt report discomfort with prefab thumb spica- fabricated this date custome thumb spica and to wear most all the time - did  add pain free AROM for thumb PA - tolerate this ate ionto better    OT Occupational Profile and History  Problem Focused Assessment - Including review of records relating to presenting problem    Occupational performance deficits (Please refer to evaluation for details):  ADL's;IADL's;Leisure;Work;Play;Social Participation    Body Structure / Function / Physical Skills  ADL;Flexibility;ROM;UE functional use;Dexterity;Edema;Pain;Strength;IADL    Rehab Potential  Fair    Clinical Decision Making  Limited treatment options,  no task modification necessary    Comorbidities Affecting Occupational Performance:  None    Modification or Assistance to Complete Evaluation   No modification of tasks or assist necessary to complete eval    OT Frequency  2x / week    OT Duration  4 weeks    OT Treatment/Interventions  Self-care/ADL training;Iontophoresis;Therapeutic exercise;Patient/family education;Splinting;Fluidtherapy;Contrast Bath;Manual Therapy    Plan  assess progress with HEP and adjust as needed    OT Home Exercise Plan  see pt instruction    Consulted and Agree with Plan of Care  Patient       Patient will benefit from skilled therapeutic intervention in order to improve the following deficits and impairments:   Body Structure / Function / Physical Skills: ADL, Flexibility, ROM, UE functional use, Dexterity, Edema, Pain, Strength, IADL       Visit Diagnosis: Radial styloid tenosynovitis  Pain in right hand  Pain in right wrist  Stiffness of right wrist, not elsewhere classified  Stiffness of right hand, not elsewhere classified  Muscle weakness (generalized)    Problem List Patient Active Problem List   Diagnosis Date Noted  . Breast cancer ([place]) 02/02/2017  . Environmental allergies 02/02/2017  . Menopausal syndrome 02/02/2017  . Chronic left hip pain 12/28/2016  . Left upper arm pain 12/28/2016  . Right foot pain 10/26/2016  . Greater trochanteric bursitis of left hip 08/03/2016  . Bilateral hand pain 04/27/2016  . Hyperlipemia   . Cognitive deficits   . Memory loss   . Lung nodule   . Malignant neoplasm of upper lobe of right lung ([place]) 05/27/2014    Rosalyn Gess OTR/L,CLT 03/06/2019, 5:03 PM  [facility] [address], [place], [address] Phone: [phone]   Fax:  [phone]  Name: Zawadi Aplin MRN: [id number] Date of Birth: May 11, 1956

## 2019-03-06 NOTE — Patient Instructions (Signed)
Same but add gentle pain free AROM to thumb PA  And AAROM RD, UD

## 2019-03-09 ENCOUNTER — Ambulatory Visit: Payer: Federal, State, Local not specified - PPO | Admitting: Occupational Therapy

## 2019-03-09 ENCOUNTER — Other Ambulatory Visit: Payer: Self-pay

## 2019-03-09 DIAGNOSIS — M25631 Stiffness of right wrist, not elsewhere classified: Secondary | ICD-10-CM

## 2019-03-09 DIAGNOSIS — M79641 Pain in right hand: Secondary | ICD-10-CM

## 2019-03-09 DIAGNOSIS — M654 Radial styloid tenosynovitis [de Quervain]: Secondary | ICD-10-CM | POA: Diagnosis not present

## 2019-03-09 DIAGNOSIS — M25531 Pain in right wrist: Secondary | ICD-10-CM

## 2019-03-09 DIAGNOSIS — M6281 Muscle weakness (generalized): Secondary | ICD-10-CM

## 2019-03-09 DIAGNOSIS — M25641 Stiffness of right hand, not elsewhere classified: Secondary | ICD-10-CM

## 2019-03-09 NOTE — Therapy (Signed)
[facility] [address] [place], [address] Phone: [phone]   Fax:  [phone]  Occupational Therapy Treatment  Patient Details  Name: Margaret Potts MRN: [id number] Date of Birth: 1956-01-16 Referring Provider (OT): Arvella Nigh   Encounter Date: 03/09/2019  OT End of Session - 03/09/19 0842    Visit Number  5    Number of Visits  10    Date for OT Re-Evaluation  03/28/19    OT Start Time  0831    OT Stop Time  0917    OT Time Calculation (min)  46 min    Activity Tolerance  Patient tolerated treatment well    Behavior During Therapy  [facility] for tasks assessed/performed       Past Medical History:  Diagnosis Date  . Arthritis   . Breast cancer ([place])    RT with mastectomy in 1995  . Cognitive deficits   . Depression   . Hyperlipemia   . Hypertension   . Lung cancer ([place])    RT upper lobe removed in OCT 2015  . Lung nodule   . Memory loss   . Seizures ([place])     Past Surgical History:  Procedure Laterality Date  . BREAST BIOPSY Left    benign  . BREAST BIOPSY Left 05/27/2017   [place] core ribbon REACTIVE FIBROSIS CONSISTENT WITH SCAR.   Marland Kitchen COLONOSCOPY WITH PROPOFOL N/A 02/09/2017   Procedure: COLONOSCOPY WITH PROPOFOL;  Surgeon: Jonathon Bellows, MD;  Location: [place];  Service: Gastroenterology;  Laterality: N/A;  . ESOPHAGOGASTRODUODENOSCOPY N/A 02/09/2017   Procedure: ESOPHAGOGASTRODUODENOSCOPY (EGD);  Surgeon: Jonathon Bellows, MD;  Location: [place];  Service: Gastroenterology;  Laterality: N/A;  . LUNG LOBECTOMY    . MASTECTOMY Right   . POLYPECTOMY N/A 02/09/2017   Procedure: POLYPECTOMY;  Surgeon: Jonathon Bellows, MD;  Location: [place];  Service: Gastroenterology;  Laterality: N/A;  . right mastectomy      There were no vitals filed for this visit.  Subjective Assessment - 03/09/19 0839    Subjective   Gettting better - slowly - splint feels better - but still about 5/10     Pertinent History  I had my symptoms slowly coming on from January - but gradually pain worsen the last 9 wks - work typing a lot and packages - and love gardening - my first  2 splints was not working - they were to [employer] - just got one yesterday that fits better    Patient Stated Goals  I want my hand and wrist to get better - so I can play gold, garden , working -   I want to retire with not pain down the road    Currently in Pain?  Yes    Pain Score  5     Pain Location  Wrist    Pain Orientation  Right    Pain Descriptors / Indicators  Aching;Tender    Pain Type  Acute pain       Assess AROM for thumb PA and RA improve  Opposition to all digits  And wrist gentle AROM flexion , etx, RD, UD  painfree range          Pt had little scratch on volar wrist - had to cut patch - but then could tolerate well  no issues    OT Treatments/Exercises (OP) - 03/09/19 0001      Iontophoresis   Type of Iontophoresis  Dexamethasone    Location  1st dorsal compartment R  - 2.0 current     Dose  med patch     Time  19      RUE Contrast Bath   Time  9 minutes    Comments  prior to AROM and soft tissue       after contrast done some graston tool brushing and sweeping volar and dorsal forearm and wrist - not radial wrist   padded some more moldskin on radial side of splint -where radial styloid is  Skin check done prior to [place]        OT Education - 03/09/19 0842    Education Details  fabricate custom thumb spica - wearing and HEP update    Person(s) Educated  Patient    Methods  Explanation;Demonstration;Tactile cues;Verbal cues;Handout    Comprehension  Returned demonstration;Verbalized understanding;Verbal cues required       OT Short Term Goals - 02/20/19 1659      OT SHORT TERM GOAL #1   Title  Pt to be independent in HEP to wear splint , do HEP and modify using her hand to decrease pain    Baseline  little knowledge , splint up to 2 days ago did not fit well -and  pain 10/10    Time  3    Period  Weeks    Status  New    Target Date  03/13/19      OT SHORT TERM GOAL #2   Title  Pain on PRWHE improve with more than 20 points    Baseline  Eval pain on PRWHE 47/50    Time  4    Period  Weeks    Status  New    Target Date  03/20/19        OT Long Term Goals - 02/20/19 1700      OT LONG TERM GOAL #1   Title  Pain decrease for pt to wean out of splint more than 75% of time    Baseline  splint 95% of time - pain 10/10 , edema and decrease AROM wrist and thumb    Time  5    Period  Weeks    Status  New    Target Date  03/27/19      OT LONG TERM GOAL #2   Title  R wrist and thumb AROM improve to WNL without increase symptoms    Baseline  decrease wrist and thumb AROM - pain 10/10    Time  5    Period  Weeks    Status  New    Target Date  03/27/19            Plan - 03/09/19 0905    Clinical Impression Statement  Pt cont ot have pain and tenderness but less than with eval - when it was 10/10 and could not even wash her forearm or touch it - AROM of thumb improve - pt to cont with pain free AROM - 2-3 x day outside of custom thumb spica    OT Occupational Profile and History  Problem Focused Assessment - Including review of records relating to presenting problem    Occupational performance deficits (Please refer to evaluation for details):  ADL's;IADL's;Leisure;Work;Play;Social Participation    Body Structure / Function / Physical Skills  ADL;Flexibility;ROM;UE functional use;Dexterity;Edema;Pain;Strength;IADL    Rehab Potential  Fair    Clinical Decision Making  Limited treatment options, no task modification necessary  Comorbidities Affecting Occupational Performance:  None    Modification or Assistance to Complete Evaluation   No modification of tasks or assist necessary to complete eval    OT Frequency  2x / week    OT Duration  4 weeks    OT Treatment/Interventions  Self-care/ADL training;Iontophoresis;Therapeutic  exercise;Patient/family education;Splinting;Fluidtherapy;Contrast Bath;Manual Therapy    Plan  assess progress with HEP and adjust as needed    OT Home Exercise Plan  see pt instruction    Consulted and Agree with Plan of Care  Patient       Patient will benefit from skilled therapeutic intervention in order to improve the following deficits and impairments:   Body Structure / Function / Physical Skills: ADL, Flexibility, ROM, UE functional use, Dexterity, Edema, Pain, Strength, IADL       Visit Diagnosis: Radial styloid tenosynovitis  Pain in right hand  Pain in right wrist  Stiffness of right wrist, not elsewhere classified  Stiffness of right hand, not elsewhere classified  Muscle weakness (generalized)    Problem List Patient Active Problem List   Diagnosis Date Noted  . Breast cancer ([place]) 02/02/2017  . Environmental allergies 02/02/2017  . Menopausal syndrome 02/02/2017  . Chronic left hip pain 12/28/2016  . Left upper arm pain 12/28/2016  . Right foot pain 10/26/2016  . Greater trochanteric bursitis of left hip 08/03/2016  . Bilateral hand pain 04/27/2016  . Hyperlipemia   . Cognitive deficits   . Memory loss   . Lung nodule   . Malignant neoplasm of upper lobe of right lung ([place]) 05/27/2014    Careli Luzader OTR/L,CLT 03/09/2019, 9:10 AM  [facility] [address], [place], [address] Phone: [phone]   Fax:  [phone]  Name: Margaret Potts MRN: [id number] Date of Birth: 1956/07/07

## 2019-03-09 NOTE — Patient Instructions (Signed)
Same as last time  

## 2019-03-13 ENCOUNTER — Ambulatory Visit: Payer: Federal, State, Local not specified - PPO | Attending: Orthopedic Surgery | Admitting: Occupational Therapy

## 2019-03-13 ENCOUNTER — Other Ambulatory Visit: Payer: Self-pay

## 2019-03-13 DIAGNOSIS — M25641 Stiffness of right hand, not elsewhere classified: Secondary | ICD-10-CM

## 2019-03-13 DIAGNOSIS — M79641 Pain in right hand: Secondary | ICD-10-CM

## 2019-03-13 DIAGNOSIS — M25631 Stiffness of right wrist, not elsewhere classified: Secondary | ICD-10-CM | POA: Insufficient documentation

## 2019-03-13 DIAGNOSIS — M654 Radial styloid tenosynovitis [de Quervain]: Secondary | ICD-10-CM | POA: Diagnosis present

## 2019-03-13 DIAGNOSIS — M6281 Muscle weakness (generalized): Secondary | ICD-10-CM | POA: Insufficient documentation

## 2019-03-13 DIAGNOSIS — M25531 Pain in right wrist: Secondary | ICD-10-CM | POA: Diagnosis present

## 2019-03-13 NOTE — Therapy (Signed)
[facility] [address] [place], [address] Phone: [phone]   Fax:  [phone]  Occupational Therapy Treatment  Patient Details  Name: Margaret Potts MRN: [id number] Date of Birth: 02/21/1956 Referring Provider (OT): Arvella Nigh   Encounter Date: 03/13/2019  OT End of Session - 03/13/19 1340    Visit Number  6    Number of Visits  10    Date for OT Re-Evaluation  03/28/19    OT Start Time  1301    OT Stop Time  1352    OT Time Calculation (min)  51 min    Activity Tolerance  Patient tolerated treatment well    Behavior During Therapy  [facility] for tasks assessed/performed       Past Medical History:  Diagnosis Date  . Arthritis   . Breast cancer ([place])    RT with mastectomy in 1995  . Cognitive deficits   . Depression   . Hyperlipemia   . Hypertension   . Lung cancer ([place])    RT upper lobe removed in OCT 2015  . Lung nodule   . Memory loss   . Seizures ([place])     Past Surgical History:  Procedure Laterality Date  . BREAST BIOPSY Left    benign  . BREAST BIOPSY Left 05/27/2017   [place] core ribbon REACTIVE FIBROSIS CONSISTENT WITH SCAR.   Marland Kitchen COLONOSCOPY WITH PROPOFOL N/A 02/09/2017   Procedure: COLONOSCOPY WITH PROPOFOL;  Surgeon: Jonathon Bellows, MD;  Location: [place];  Service: Gastroenterology;  Laterality: N/A;  . ESOPHAGOGASTRODUODENOSCOPY N/A 02/09/2017   Procedure: ESOPHAGOGASTRODUODENOSCOPY (EGD);  Surgeon: Jonathon Bellows, MD;  Location: [place];  Service: Gastroenterology;  Laterality: N/A;  . LUNG LOBECTOMY    . MASTECTOMY Right   . POLYPECTOMY N/A 02/09/2017   Procedure: POLYPECTOMY;  Surgeon: Jonathon Bellows, MD;  Location: [place];  Service: Gastroenterology;  Laterality: N/A;  . right mastectomy      There were no vitals filed for this visit.  Subjective Assessment - 03/13/19 1302    Subjective   I still cannot wash my hair, put pressure thru my hands , but not as  tender and I have little more motion - can wash my arm    Pertinent History  I had my symptoms slowly coming on from January - but gradually pain worsen the last 9 wks - work typing a lot and packages - and love gardening - my first  2 splints was not working - they were to [employer] - just got one yesterday that fits better    Patient Stated Goals  I want my hand and wrist to get better - so I can play gold, garden , working -   I want to retire with not pain down the road    Currently in Pain?  Yes    Pain Score  5     Pain Location  Arm    Pain Orientation  Right    Pain Descriptors / Indicators  Aching;Tender    Pain Type  Acute pain    Pain Onset  More than a month ago    Pain Frequency  Constant         [facility] OT Assessment - 03/13/19 0001      AROM   Right Wrist Extension  58 Degrees    Right Wrist Flexion  73 Degrees    Right Wrist Radial Deviation  20 Degrees    Right  Wrist Ulnar Deviation  18 Degrees       assess AROM for wrist and digits - see flowsheet - progress   stiffness in composite flexion and extention of digits         OT Treatments/Exercises (OP) - 03/13/19 0001      Iontophoresis   Type of Iontophoresis  Dexamethasone    Location  1st dorsal compartment R  - 2.0 current     Dose  med patch     Time  19      RUE Contrast Bath   Time  9 minutes    Comments  prior to AROM to wrist, thumb and diigtis        decrease stiffness - tapping of digits extention and extention of all digits with palm on table    soft tissue mobs done to forearm - graston tool nr 2 done on volar and dorsal forearm , and radial  Webspace soft tissue mobs by OT prior to thumb PA and RA   Skin check done prior -and tolerate well -pt to keep on for about hour      OT Education - 03/13/19 1337    Education Details  progress in AROM  and pain - HEP    Person(s) Educated  Patient    Methods  Explanation;Demonstration;Tactile cues;Verbal cues;Handout    Comprehension  Returned  demonstration;Verbalized understanding;Verbal cues required       OT Short Term Goals - 02/20/19 1659      OT SHORT TERM GOAL #1   Title  Pt to be independent in HEP to wear splint , do HEP and modify using her hand to decrease pain    Baseline  little knowledge , splint up to 2 days ago did not fit well -and pain 10/10    Time  3    Period  Weeks    Status  New    Target Date  03/13/19      OT SHORT TERM GOAL #2   Title  Pain on PRWHE improve with more than 20 points    Baseline  Eval pain on PRWHE 47/50    Time  4    Period  Weeks    Status  New    Target Date  03/20/19        OT Long Term Goals - 02/20/19 1700      OT LONG TERM GOAL #1   Title  Pain decrease for pt to wean out of splint more than 75% of time    Baseline  splint 95% of time - pain 10/10 , edema and decrease AROM wrist and thumb    Time  5    Period  Weeks    Status  New    Target Date  03/27/19      OT LONG TERM GOAL #2   Title  R wrist and thumb AROM improve to WNL without increase symptoms    Baseline  decrease wrist and thumb AROM - pain 10/10    Time  5    Period  Weeks    Status  New    Target Date  03/27/19            Plan - 03/13/19 1341    Clinical Impression Statement  Pt's pain decrease from 10/10 at [facility] to 4-5/10 , show  increase AROM in wrist and digits - tender over 1st dorsal compartment  and edema - but doing better with new custom thumb spica splint  since session 4    OT Occupational Profile and History  Problem Focused Assessment - Including review of records relating to presenting problem    Occupational performance deficits (Please refer to evaluation for details):  ADL's;IADL's;Leisure;Work;Play;Social Participation    Body Structure / Function / Physical Skills  ADL;Flexibility;ROM;UE functional use;Dexterity;Edema;Pain;Strength;IADL    Rehab Potential  Fair    Clinical Decision Making  Limited treatment options, no task modification necessary    Comorbidities Affecting  Occupational Performance:  None    Modification or Assistance to Complete Evaluation   No modification of tasks or assist necessary to complete eval    OT Frequency  2x / week    OT Duration  4 weeks    OT Treatment/Interventions  Self-care/ADL training;Iontophoresis;Therapeutic exercise;Patient/family education;Splinting;Fluidtherapy;Contrast Bath;Manual Therapy    Plan  assess progress with HEP and adjust as needed    OT Home Exercise Plan  see pt instruction    Consulted and Agree with Plan of Care  Patient       Patient will benefit from skilled therapeutic intervention in order to improve the following deficits and impairments:   Body Structure / Function / Physical Skills: ADL, Flexibility, ROM, UE functional use, Dexterity, Edema, Pain, Strength, IADL       Visit Diagnosis: Radial styloid tenosynovitis  Pain in right hand  Pain in right wrist  Stiffness of right wrist, not elsewhere classified  Stiffness of right hand, not elsewhere classified  Muscle weakness (generalized)    Problem List Patient Active Problem List   Diagnosis Date Noted  . Breast cancer ([place]) 02/02/2017  . Environmental allergies 02/02/2017  . Menopausal syndrome 02/02/2017  . Chronic left hip pain 12/28/2016  . Left upper arm pain 12/28/2016  . Right foot pain 10/26/2016  . Greater trochanteric bursitis of left hip 08/03/2016  . Bilateral hand pain 04/27/2016  . Hyperlipemia   . Cognitive deficits   . Memory loss   . Lung nodule   . 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 [place], [address] Phone: [phone]   Fax:  [phone]  Name: Margaret Potts MRN: [id number] Date of Birth: Dec 09, 1955

## 2019-03-13 NOTE — Patient Instructions (Signed)
Same HEP - tapping of digits extention

## 2019-03-16 ENCOUNTER — Ambulatory Visit: Payer: Federal, State, Local not specified - PPO | Admitting: Occupational Therapy

## 2019-03-16 ENCOUNTER — Other Ambulatory Visit: Payer: Self-pay

## 2019-03-16 DIAGNOSIS — M79641 Pain in right hand: Secondary | ICD-10-CM

## 2019-03-16 DIAGNOSIS — M25631 Stiffness of right wrist, not elsewhere classified: Secondary | ICD-10-CM

## 2019-03-16 DIAGNOSIS — M6281 Muscle weakness (generalized): Secondary | ICD-10-CM

## 2019-03-16 DIAGNOSIS — M654 Radial styloid tenosynovitis [de Quervain]: Secondary | ICD-10-CM | POA: Diagnosis not present

## 2019-03-16 DIAGNOSIS — M25531 Pain in right wrist: Secondary | ICD-10-CM

## 2019-03-16 DIAGNOSIS — M25641 Stiffness of right hand, not elsewhere classified: Secondary | ICD-10-CM

## 2019-03-16 NOTE — Patient Instructions (Signed)
Same

## 2019-03-16 NOTE — Therapy (Signed)
[facility] [address], [place], [address] Phone: [phone]   Fax:  [phone]  Occupational Therapy Treatment  Patient Details  Name: Margaret Potts MRN: [id number] Date of Birth: Sep 13, 1955 Referring Provider (OT): Arvella Nigh   Encounter Date: 03/16/2019  OT End of Session - 03/16/19 0845    Visit Number  7    Number of Visits  10    Date for OT Re-Evaluation  03/28/19    OT Start Time  0830    OT Stop Time  0930    OT Time Calculation (min)  60 min    Activity Tolerance  Patient tolerated treatment well    Behavior During Therapy  [facility] for tasks assessed/performed       Past Medical History:  Diagnosis Date  . Arthritis   . Breast cancer ([place])    RT with mastectomy in 1995  . Cognitive deficits   . Depression   . Hyperlipemia   . Hypertension   . Lung cancer ([place])    RT upper lobe removed in OCT 2015  . Lung nodule   . Memory loss   . Seizures ([place])     Past Surgical History:  Procedure Laterality Date  . BREAST BIOPSY Left    benign  . BREAST BIOPSY Left 05/27/2017   [place] core ribbon REACTIVE FIBROSIS CONSISTENT WITH SCAR.   Marland Kitchen COLONOSCOPY WITH PROPOFOL N/A 02/09/2017   Procedure: COLONOSCOPY WITH PROPOFOL;  Surgeon: Jonathon Bellows, MD;  Location: [place];  Service: Gastroenterology;  Laterality: N/A;  . ESOPHAGOGASTRODUODENOSCOPY N/A 02/09/2017   Procedure: ESOPHAGOGASTRODUODENOSCOPY (EGD);  Surgeon: Jonathon Bellows, MD;  Location: [place];  Service: Gastroenterology;  Laterality: N/A;  . LUNG LOBECTOMY    . MASTECTOMY Right   . POLYPECTOMY N/A 02/09/2017   Procedure: POLYPECTOMY;  Surgeon: Jonathon Bellows, MD;  Location: [place];  Service: Gastroenterology;  Laterality: N/A;  . right mastectomy      There were no vitals filed for this visit.  Subjective Assessment - 03/16/19 0840    Subjective   I can tell my pain still about 4-5 /10 - but more motion - not as  tight - but the L acting out because of using it now a lot    Pertinent History  I had my symptoms slowly coming on from January - but gradually pain worsen the last 9 wks - work typing a lot and packages - and love gardening - my first  2 splints was not working - they were to [employer] - just got one yesterday that fits better    Patient Stated Goals  I want my hand and wrist to get better - so I can play gold, garden , working -   I want to retire with not pain down the road    Currently in Pain?  Yes    Pain Score  4     Pain Location  Wrist    Pain Orientation  Right    Pain Descriptors / Indicators  Aching;Tender    Pain Type  Acute pain    Pain Onset  More than a month ago                   OT Treatments/Exercises (OP) - 03/16/19 0001      Ultrasound   Ultrasound Location  webspace    Ultrasound Parameters  3.3MHZ, 20%, 0.8 intensity     Ultrasound Goals  Pain  Iontophoresis   Type of Iontophoresis  Dexamethasone    Location  1st dorsal compartment R  - 1.5 current     Dose  med patch     Time  23      RUE Contrast Bath   Time  9 minutes    Comments  prior to AROM and soft tisuse      skin check done prior - pt tolerate but little more tender - lower current   decrease stiffness - tapping of digits extention and extention of all digits with palm on table    AAROM for wrist in all planes  cont to have pain with PA and RA of thumb  Opposition to 5th with some pain   Soft tissue mobs to webspace - prior to [place]  Cont splint wearing        Can ask her Acupuncture for webspace - do not want at this time dryneelding     OT Education - 03/16/19 0844    Education Details  ask acupuncture to do webspace next time    Person(s) Educated  Patient    Methods  Explanation;Demonstration;Tactile cues;Verbal cues;Handout    Comprehension  Returned demonstration;Verbalized understanding;Verbal cues required       OT Short Term Goals - 02/20/19 1659      OT  SHORT TERM GOAL #1   Title  Pt to be independent in HEP to wear splint , do HEP and modify using her hand to decrease pain    Baseline  little knowledge , splint up to 2 days ago did not fit well -and pain 10/10    Time  3    Period  Weeks    Status  New    Target Date  03/13/19      OT SHORT TERM GOAL #2   Title  Pain on PRWHE improve with more than 20 points    Baseline  Eval pain on PRWHE 47/50    Time  4    Period  Weeks    Status  New    Target Date  03/20/19        OT Long Term Goals - 02/20/19 1700      OT LONG TERM GOAL #1   Title  Pain decrease for pt to wean out of splint more than 75% of time    Baseline  splint 95% of time - pain 10/10 , edema and decrease AROM wrist and thumb    Time  5    Period  Weeks    Status  New    Target Date  03/27/19      OT LONG TERM GOAL #2   Title  R wrist and thumb AROM improve to WNL without increase symptoms    Baseline  decrease wrist and thumb AROM - pain 10/10    Time  5    Period  Weeks    Status  New    Target Date  03/27/19            Plan - 03/16/19 0846    Clinical Impression Statement  Pt cont to be tender and swollne over 1st dorsal  compartment - pain with RA of thumb more than PA - webspace still tight - pt to ask her acupuncture to do some in webspace - cont wtih splint wearing and HEP painfree ROM , contrast    OT Occupational Profile and History  Problem Focused Assessment - Including review of records relating to presenting problem  Occupational performance deficits (Please refer to evaluation for details):  ADL's;IADL's;Leisure;Work;Play;Social Participation    Body Structure / Function / Physical Skills  ADL;Flexibility;ROM;UE functional use;Dexterity;Edema;Pain;Strength;IADL    Rehab Potential  Fair    Clinical Decision Making  Limited treatment options, no task modification necessary    Comorbidities Affecting Occupational Performance:  None    Modification or Assistance to Complete Evaluation   No  modification of tasks or assist necessary to complete eval    OT Frequency  2x / week    OT Duration  4 weeks    OT Treatment/Interventions  Self-care/ADL training;Iontophoresis;Therapeutic exercise;Patient/family education;Splinting;Fluidtherapy;Contrast Bath;Manual Therapy    Plan  assess progress with HEP and adjust as needed    OT Home Exercise Plan  see pt instruction    Consulted and Agree with Plan of Care  Patient       Patient will benefit from skilled therapeutic intervention in order to improve the following deficits and impairments:   Body Structure / Function / Physical Skills: ADL, Flexibility, ROM, UE functional use, Dexterity, Edema, Pain, Strength, IADL       Visit Diagnosis: Radial styloid tenosynovitis  Pain in right hand  Pain in right wrist  Stiffness of right wrist, not elsewhere classified  Stiffness of right hand, not elsewhere classified  Muscle weakness (generalized)    Problem List Patient Active Problem List   Diagnosis Date Noted  . Breast cancer ([place]) 02/02/2017  . Environmental allergies 02/02/2017  . Menopausal syndrome 02/02/2017  . Chronic left hip pain 12/28/2016  . Left upper arm pain 12/28/2016  . Right foot pain 10/26/2016  . Greater trochanteric bursitis of left hip 08/03/2016  . Bilateral hand pain 04/27/2016  . Hyperlipemia   . Cognitive deficits   . Memory loss   . Lung nodule   . Malignant neoplasm of upper lobe of right lung ([place]) 05/27/2014    Rosalyn Gess OTR/L,CLT 03/16/2019, 9:59 AM  [facility] [address] [place], [address] Phone: [phone]   Fax:  [phone]  Name: Margaret Potts MRN: [id number] Date of Birth: 1955-07-25

## 2019-03-20 ENCOUNTER — Other Ambulatory Visit: Payer: Self-pay

## 2019-03-20 ENCOUNTER — Ambulatory Visit: Payer: Federal, State, Local not specified - PPO | Admitting: Occupational Therapy

## 2019-03-20 DIAGNOSIS — M25631 Stiffness of right wrist, not elsewhere classified: Secondary | ICD-10-CM

## 2019-03-20 DIAGNOSIS — M79641 Pain in right hand: Secondary | ICD-10-CM

## 2019-03-20 DIAGNOSIS — M25531 Pain in right wrist: Secondary | ICD-10-CM

## 2019-03-20 DIAGNOSIS — M25641 Stiffness of right hand, not elsewhere classified: Secondary | ICD-10-CM

## 2019-03-20 DIAGNOSIS — M654 Radial styloid tenosynovitis [de Quervain]: Secondary | ICD-10-CM | POA: Diagnosis not present

## 2019-03-20 DIAGNOSIS — M6281 Muscle weakness (generalized): Secondary | ICD-10-CM

## 2019-03-20 NOTE — Therapy (Signed)
[facility] [facility] [address], [place], [address] Phone: [phone]   Fax:  [phone]  Occupational Therapy Treatment  Patient Details  Name: Margaret Potts MRN: [id number] Date of Birth: 10-07-1955 Referring Provider (OT): Arvella Nigh   Encounter Date: 03/20/2019  OT End of Session - 03/20/19 1028    Visit Number  8    Number of Visits  10    Date for OT Re-Evaluation  03/28/19    OT Start Time  0815    OT Stop Time  0910    OT Time Calculation (min)  55 min    Activity Tolerance  Patient tolerated treatment well    Behavior During Therapy  [facility] for tasks assessed/performed       Past Medical History:  Diagnosis Date  . Arthritis   . Breast cancer ([place])    RT with mastectomy in 1995  . Cognitive deficits   . Depression   . Hyperlipemia   . Hypertension   . Lung cancer ([place])    RT upper lobe removed in OCT 2015  . Lung nodule   . Memory loss   . Seizures ([place])     Past Surgical History:  Procedure Laterality Date  . BREAST BIOPSY Left    benign  . BREAST BIOPSY Left 05/27/2017   [place] core ribbon REACTIVE FIBROSIS CONSISTENT WITH SCAR.   Marland Kitchen COLONOSCOPY WITH PROPOFOL N/A 02/09/2017   Procedure: COLONOSCOPY WITH PROPOFOL;  Surgeon: Jonathon Bellows, MD;  Location: [place];  Service: Gastroenterology;  Laterality: N/A;  . ESOPHAGOGASTRODUODENOSCOPY N/A 02/09/2017   Procedure: ESOPHAGOGASTRODUODENOSCOPY (EGD);  Surgeon: Jonathon Bellows, MD;  Location: [place];  Service: Gastroenterology;  Laterality: N/A;  . LUNG LOBECTOMY    . MASTECTOMY Right   . POLYPECTOMY N/A 02/09/2017   Procedure: POLYPECTOMY;  Surgeon: Jonathon Bellows, MD;  Location: [place];  Service: Gastroenterology;  Laterality: N/A;  . right mastectomy      There were no vitals filed for this visit.  Subjective Assessment - 03/20/19 0827    Subjective   Pain about same - still pain with bending my thumb all the way in to  palm -and out - feel weak - but better grip    Pertinent History  I had my symptoms slowly coming on from January - but gradually pain worsen the last 9 wks - work typing a lot and packages - and love gardening - my first  2 splints was not working - they were to [employer] - just got one yesterday that fits better    Patient Stated Goals  I want my hand and wrist to get better - so I can play gold, garden , working -   I want to retire with not pain down the road    Currently in Pain?  Yes    Pain Score  4     Pain Location  Wrist    Pain Orientation  Right    Pain Descriptors / Indicators  Aching;Tender    Pain Type  Acute pain    Pain Onset  More than a month ago    Pain Frequency  Constant         [facility] OT Assessment - 03/20/19 0001      Strength   Right Hand Grip (lbs)  25               OT Treatments/Exercises (OP) - 03/20/19 0001  Iontophoresis   Type of Iontophoresis  Dexamethasone    Location  1st dorsal compartment- med patch , 2.0 current     Dose  med patch     Time  19      RUE Contrast Bath   Time  9 minutes    Comments  prior to AROM and AAROM - soft tissue mobs         Assess AROM for thumb PA and RA  Opposition to all digits - slide down 2nd fold Tightness in webspace - pt declined dry needling -talk again with pt about then getting her acupuncture to do webspace and radial forearm  because pain should be less than 5 /10 at 8 sessions   And wrist gentle AROM flexion , etx, RD, UD  painfree range   Graston tool nr 2 done sweeping in volar forearm and radial -and webspace soft tissue mobs   Skin check done prior and pt to keep on for hour afterwards     OT Education - 03/20/19 1028    Education Details  ask acupuncture to do webspace next time and radial forearm    Person(s) Educated  Patient    Methods  Explanation;Demonstration;Tactile cues;Verbal cues;Handout    Comprehension  Returned demonstration;Verbalized understanding;Verbal cues  required       OT Short Term Goals - 02/20/19 1659      OT SHORT TERM GOAL #1   Title  Pt to be independent in HEP to wear splint , do HEP and modify using her hand to decrease pain    Baseline  little knowledge , splint up to 2 days ago did not fit well -and pain 10/10    Time  3    Period  Weeks    Status  New    Target Date  03/13/19      OT SHORT TERM GOAL #2   Title  Pain on PRWHE improve with more than 20 points    Baseline  Eval pain on PRWHE 47/50    Time  4    Period  Weeks    Status  New    Target Date  03/20/19        OT Long Term Goals - 02/20/19 1700      OT LONG TERM GOAL #1   Title  Pain decrease for pt to wean out of splint more than 75% of time    Baseline  splint 95% of time - pain 10/10 , edema and decrease AROM wrist and thumb    Time  5    Period  Weeks    Status  New    Target Date  03/27/19      OT LONG TERM GOAL #2   Title  R wrist and thumb AROM improve to WNL without increase symptoms    Baseline  decrease wrist and thumb AROM - pain 10/10    Time  5    Period  Weeks    Status  New    Target Date  03/27/19            Plan - 03/20/19 1029    Clinical Impression Statement  Pt cont to be tender 4-5/10 over 1 st dorsal compartment - pt decline the last week or 2 dryneelding to webspace - wants to have her acupuncture person do it - pt decrease to be seen one time after that visit next week - discuss wiht pt that webspace is tight and that will help  pain - pain should be lower by 8 session of ionto - will follow up next week after acupuncture    OT Occupational Profile and History  Problem Focused Assessment - Including review of records relating to presenting problem    Occupational performance deficits (Please refer to evaluation for details):  ADL's;IADL's;Leisure;Work;Play;Social Participation    Body Structure / Function / Physical Skills  ADL;Flexibility;ROM;UE functional use;Dexterity;Edema;Pain;Strength;IADL    Rehab Potential  Fair     Clinical Decision Making  Limited treatment options, no task modification necessary    Comorbidities Affecting Occupational Performance:  None    Modification or Assistance to Complete Evaluation   No modification of tasks or assist necessary to complete eval    OT Frequency  2x / week    OT Duration  2 weeks    OT Treatment/Interventions  Self-care/ADL training;Iontophoresis;Therapeutic exercise;Patient/family education;Splinting;Fluidtherapy;Contrast Bath;Manual Therapy    Plan  progress after acupuncture    OT Home Exercise Plan  see pt instruction    Consulted and Agree with Plan of Care  Patient       Patient will benefit from skilled therapeutic intervention in order to improve the following deficits and impairments:   Body Structure / Function / Physical Skills: ADL, Flexibility, ROM, UE functional use, Dexterity, Edema, Pain, Strength, IADL       Visit Diagnosis: Radial styloid tenosynovitis  Pain in right hand  Pain in right wrist  Stiffness of right wrist, not elsewhere classified  Stiffness of right hand, not elsewhere classified  Muscle weakness (generalized)    Problem List Patient Active Problem List   Diagnosis Date Noted  . Breast cancer ([place]) 02/02/2017  . Environmental allergies 02/02/2017  . Menopausal syndrome 02/02/2017  . Chronic left hip pain 12/28/2016  . Left upper arm pain 12/28/2016  . Right foot pain 10/26/2016  . Greater trochanteric bursitis of left hip 08/03/2016  . Bilateral hand pain 04/27/2016  . Hyperlipemia   . Cognitive deficits   . Memory loss   . Lung nodule   . Malignant neoplasm of upper lobe of right lung ([place]) 05/27/2014    Rosalyn Gess OTR/L,CLT 03/20/2019, 12:01 PM  [facility] [address] [place], [address] Phone: [phone]   Fax:  [phone]  Name: Deija Buhrman MRN: [id number] Date of Birth: 03-May-1956

## 2019-03-20 NOTE — Patient Instructions (Signed)
Pt to discuss with her acupuncture - to do some in webspace and radial forearm

## 2019-03-23 ENCOUNTER — Ambulatory Visit: Payer: Federal, State, Local not specified - PPO | Admitting: Occupational Therapy

## 2019-03-30 ENCOUNTER — Ambulatory Visit: Payer: Federal, State, Local not specified - PPO | Admitting: Occupational Therapy

## 2019-04-05 ENCOUNTER — Other Ambulatory Visit: Payer: Self-pay

## 2019-04-05 ENCOUNTER — Ambulatory Visit: Payer: Federal, State, Local not specified - PPO | Admitting: Occupational Therapy

## 2019-04-05 DIAGNOSIS — M654 Radial styloid tenosynovitis [de Quervain]: Secondary | ICD-10-CM

## 2019-04-05 DIAGNOSIS — M79641 Pain in right hand: Secondary | ICD-10-CM

## 2019-04-05 DIAGNOSIS — M25531 Pain in right wrist: Secondary | ICD-10-CM

## 2019-04-05 DIAGNOSIS — M6281 Muscle weakness (generalized): Secondary | ICD-10-CM

## 2019-04-05 DIAGNOSIS — M25641 Stiffness of right hand, not elsewhere classified: Secondary | ICD-10-CM

## 2019-04-05 DIAGNOSIS — M25631 Stiffness of right wrist, not elsewhere classified: Secondary | ICD-10-CM

## 2019-04-05 NOTE — Therapy (Signed)
[facility] [address], [place], [address] Phone: [phone]   Fax:  [phone]  Occupational Therapy Treatment  Patient Details  Name: Margaret Potts MRN: [id number] Date of Birth: Jun 14, 1956 Referring Provider (OT): Arvella Nigh   Encounter Date: 04/05/2019  OT End of Session - 04/05/19 1457    Visit Number  9    Number of Visits  15    Date for OT Re-Evaluation  05/03/19    OT Start Time  1431    OT Stop Time  1530    OT Time Calculation (min)  59 min    Activity Tolerance  Patient tolerated treatment well    Behavior During Therapy  [facility] for tasks assessed/performed       Past Medical History:  Diagnosis Date  . Arthritis   . Breast cancer ([place])    RT with mastectomy in 1995  . Cognitive deficits   . Depression   . Hyperlipemia   . Hypertension   . Lung cancer ([place])    RT upper lobe removed in OCT 2015  . Lung nodule   . Memory loss   . Seizures ([place])     Past Surgical History:  Procedure Laterality Date  . BREAST BIOPSY Left    benign  . BREAST BIOPSY Left 05/27/2017   [place] core ribbon REACTIVE FIBROSIS CONSISTENT WITH SCAR.   Marland Kitchen COLONOSCOPY WITH PROPOFOL N/A 02/09/2017   Procedure: COLONOSCOPY WITH PROPOFOL;  Surgeon: Jonathon Bellows, MD;  Location: [place];  Service: Gastroenterology;  Laterality: N/A;  . ESOPHAGOGASTRODUODENOSCOPY N/A 02/09/2017   Procedure: ESOPHAGOGASTRODUODENOSCOPY (EGD);  Surgeon: Jonathon Bellows, MD;  Location: [place];  Service: Gastroenterology;  Laterality: N/A;  . LUNG LOBECTOMY    . MASTECTOMY Right   . POLYPECTOMY N/A 02/09/2017   Procedure: POLYPECTOMY;  Surgeon: Jonathon Bellows, MD;  Location: [place];  Service: Gastroenterology;  Laterality: N/A;  . right mastectomy      There were no vitals filed for this visit.  Subjective Assessment - 04/05/19 1453    Subjective   I did had 2 sessions of acupuncture - and then I wore the splint -  some better but still pain with motion - not as tender as I was    Pertinent History  I had my symptoms slowly coming on from January - but gradually pain worsen the last 9 wks - work typing a lot and packages - and love gardening - my first  2 splints was not working - they were to [employer] - just got one yesterday that fits better    Patient Stated Goals  I want my hand and wrist to get better - so I can play gold, garden , working -   I want to retire with not pain down the road    Currently in Pain?  Yes    Pain Score  4    in splint - tender over distal radius head 8/10   Pain Location  Wrist    Pain Orientation  Right    Pain Descriptors / Indicators  Aching         [facility] OT Assessment - 04/05/19 0001      AROM   Right Wrist Extension  60 Degrees    Right Wrist Flexion  80 Degrees    Right Wrist Radial Deviation  22 Degrees    Right Wrist Ulnar Deviation  15 Degrees      Strength  Right Hand Grip (lbs)  26    Right Hand Lateral Pinch  9 lbs    Right Hand 3 Point Pinch  5 lbs    Left Hand Grip (lbs)  45    Left Hand Lateral Pinch  11 lbs    Left Hand 3 Point Pinch  12 lbs      Right Hand AROM   R Thumb Radial ABduction/ADduction 0-55  45    R Thumb Palmar ABduction/ADduction 0-45  48      Pt was not seen for 2 wks - pt had couple of acupuncture sessions- increase PA and RA - less tightness in webspace  And AROM for wrist increase - with less pain and tenderness  But pt tender 8/10 over distal radius head  And Finkelstein positive   Pt to cont to wear her thumb spica most all the time         skin check done prior - had to move proximal patch to upper arm - pt to take off patch on hour after leaving - tolerated well  OT Treatments/Exercises (OP) - 04/05/19 0001      Iontophoresis   Type of Iontophoresis  Dexamethasone    Location  1st dorsal compartment- med patch , 1.5 current     Dose  med patch     Time  19      RUE Contrast Bath   Time  9 minutes     Comments  prior to AROM andPROM       Graston tool nr 2 done volar forearm and wrist - radial forearm  Joint mobs and gentle traction to wrist  And MC and CT spreads   Wrist AAROM in all planes Thumb PA and RA AROM          OT Education - 04/05/19 1456    Education Details  progress and POC - and again some acupuncture sessions    Person(s) Educated  Patient    Methods  Explanation;Demonstration;Tactile cues;Verbal cues;Handout    Comprehension  Returned demonstration;Verbalized understanding;Verbal cues required       OT Short Term Goals - 04/05/19 1724      OT SHORT TERM GOAL #1   Title  Pt to be independent in HEP to wear splint , do HEP and modify using her hand to decrease pain    Status  Achieved      OT SHORT TERM GOAL #2   Title  Pain on PRWHE improve with more than 20 points    Baseline  Eval pain on PRWHE 47/50 - pain improving but still pain with thumb AROM , wrist flexion , and Finkelstein , tenderness over distal radius head    Time  3    Period  Weeks    Status  On-going    Target Date  04/26/19        OT Long Term Goals - 04/05/19 1725      OT LONG TERM GOAL #1   Title  Pain decrease for pt to wean out of splint more than 75% of time    Baseline  splint 95% of time - pain 10/10 , edema and decrease AROM wrist and thumb- pain improving but still 8/10 tenderness , splint wearing 95% of time    Time  3    Period  Weeks    Status  On-going    Target Date  04/26/19      OT LONG TERM GOAL #2   Title  R wrist and thumb AROM improve to WNL without increase symptoms    Baseline  AROM increase wiht less pain -but pain still thumb PA, RA, opposition ,and wrist flexion    Time  3    Period  Weeks    Status  On-going    Target Date  04/26/19            Plan - 04/05/19 1722    Clinical Impression Statement  Pt return this date after not seen for 2 wks - pt had couple of acupuncture sessions - and thumb PA and RA increase - and webspace not as tight  - AROM in wrist improve and pain- but still tender 8/10 over distal radius head and Finkelstein positive - pt will do 3 more weeks with acupuncture and ionto    OT Occupational Profile and History  Problem Focused Assessment - Including review of records relating to presenting problem    Occupational performance deficits (Please refer to evaluation for details):  ADL's;IADL's;Leisure;Work;Play;Social Participation    Body Structure / Function / Physical Skills  ADL;Flexibility;ROM;UE functional use;Dexterity;Edema;Pain;Strength;IADL    Rehab Potential  Fair    Clinical Decision Making  Limited treatment options, no task modification necessary    Comorbidities Affecting Occupational Performance:  None    Modification or Assistance to Complete Evaluation   No modification of tasks or assist necessary to complete eval    OT Frequency  2x / week    OT Duration  --   3 wks   OT Treatment/Interventions  Self-care/ADL training;Iontophoresis;Therapeutic exercise;Patient/family education;Splinting;Fluidtherapy;Contrast Bath;Manual Therapy    Plan  progress after acupuncture and ionto    OT Home Exercise Plan  see pt instruction    Consulted and Agree with Plan of Care  Patient       Patient will benefit from skilled therapeutic intervention in order to improve the following deficits and impairments:   Body Structure / Function / Physical Skills: ADL, Flexibility, ROM, UE functional use, Dexterity, Edema, Pain, Strength, IADL       Visit Diagnosis: Radial styloid tenosynovitis - Plan: Ot plan of care cert/re-cert  Pain in right hand - Plan: Ot plan of care cert/re-cert  Pain in right wrist - Plan: Ot plan of care cert/re-cert  Stiffness of right wrist, not elsewhere classified - Plan: Ot plan of care cert/re-cert  Stiffness of right hand, not elsewhere classified - Plan: Ot plan of care cert/re-cert  Muscle weakness (generalized) - Plan: Ot plan of care cert/re-cert    Problem  List Patient Active Problem List   Diagnosis Date Noted  . Breast cancer ([place]) 02/02/2017  . Environmental allergies 02/02/2017  . Menopausal syndrome 02/02/2017  . Chronic left hip pain 12/28/2016  . Left upper arm pain 12/28/2016  . Right foot pain 10/26/2016  . Greater trochanteric bursitis of left hip 08/03/2016  . Bilateral hand pain 04/27/2016  . Hyperlipemia   . Cognitive deficits   . Memory loss   . Lung nodule   . 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 [place], [address] Phone: [phone]   Fax:  [phone]  Name: Margaret Potts MRN: [id number] Date of Birth: 23-Jan-1956

## 2019-04-05 NOTE — Patient Instructions (Signed)
same

## 2019-04-12 ENCOUNTER — Ambulatory Visit: Payer: Federal, State, Local not specified - PPO | Attending: Orthopedic Surgery | Admitting: Occupational Therapy

## 2019-04-12 ENCOUNTER — Other Ambulatory Visit: Payer: Self-pay

## 2019-04-12 DIAGNOSIS — M6281 Muscle weakness (generalized): Secondary | ICD-10-CM | POA: Diagnosis present

## 2019-04-12 DIAGNOSIS — M79641 Pain in right hand: Secondary | ICD-10-CM | POA: Insufficient documentation

## 2019-04-12 DIAGNOSIS — M25641 Stiffness of right hand, not elsewhere classified: Secondary | ICD-10-CM | POA: Diagnosis present

## 2019-04-12 DIAGNOSIS — M654 Radial styloid tenosynovitis [de Quervain]: Secondary | ICD-10-CM

## 2019-04-12 DIAGNOSIS — M25531 Pain in right wrist: Secondary | ICD-10-CM

## 2019-04-12 DIAGNOSIS — M25631 Stiffness of right wrist, not elsewhere classified: Secondary | ICD-10-CM | POA: Insufficient documentation

## 2019-04-13 ENCOUNTER — Encounter: Payer: Self-pay | Admitting: Occupational Therapy

## 2019-04-13 NOTE — Therapy (Signed)
[facility] [address] [place], [address] Phone: [phone]   Fax:  [phone]  Occupational Therapy Treatment  Patient Details  Name: Margaret Potts MRN: [id number] Date of Birth: [birth year] Referring Provider (OT): Arvella Nigh   Encounter Date: 04/12/2019  OT End of Session - 04/14/19 1916    Visit Number  10    Number of Visits  15    Date for OT Re-Evaluation  05/03/19    OT Start Time  1455    OT Stop Time  1545    OT Time Calculation (min)  50 min    Activity Tolerance  Patient tolerated treatment well    Behavior During Therapy  [facility] for tasks assessed/performed       Past Medical History:  Diagnosis Date  . Arthritis   . Breast cancer ([place])    RT with mastectomy in 1995  . Cognitive deficits   . Depression   . Hyperlipemia   . Hypertension   . Lung cancer ([place])    RT upper lobe removed in OCT 2015  . Lung nodule   . Memory loss   . Seizures ([place])     Past Surgical History:  Procedure Laterality Date  . BREAST BIOPSY Left    benign  . BREAST BIOPSY Left 05/27/2017   [place] core ribbon REACTIVE FIBROSIS CONSISTENT WITH SCAR.   Marland Kitchen COLONOSCOPY WITH PROPOFOL N/A 02/09/2017   Procedure: COLONOSCOPY WITH PROPOFOL;  Surgeon: Jonathon Bellows, MD;  Location: [place];  Service: Gastroenterology;  Laterality: N/A;  . ESOPHAGOGASTRODUODENOSCOPY N/A 02/09/2017   Procedure: ESOPHAGOGASTRODUODENOSCOPY (EGD);  Surgeon: Jonathon Bellows, MD;  Location: [place];  Service: Gastroenterology;  Laterality: N/A;  . LUNG LOBECTOMY    . MASTECTOMY Right   . POLYPECTOMY N/A 02/09/2017   Procedure: POLYPECTOMY;  Surgeon: Jonathon Bellows, MD;  Location: [place];  Service: Gastroenterology;  Laterality: N/A;  . right mastectomy      There were no vitals filed for this visit.  Subjective Assessment - 04/14/19 1915    Subjective   She is working but using left hand for all tasks at the [employer]  and now is feeling tender on the left side.    Pertinent History  I had my symptoms slowly coming on from January - but gradually pain worsen the last 9 wks - work typing a lot and packages - and love gardening - my first  2 splints was not working - they were to [employer] - just got one yesterday that fits better    Patient Stated Goals  I want my hand and wrist to get better - so I can play gold, garden , working -   I want to retire with not pain down the road    Currently in Pain?  Yes    Pain Score  4     Pain Location  Wrist    Pain Orientation  Right    Pain Descriptors / Indicators  Aching    Pain Type  Acute pain    Pain Onset  More than a month ago    Pain Frequency  Intermittent         [facility] OT Assessment - 04/14/19 1912      AROM   Right Wrist Extension  62 Degrees    Right Wrist Flexion  70 Degrees    Right Wrist Radial Deviation  20 Degrees    Right Wrist Ulnar Deviation  20 Degrees    Left Wrist Extension  74 Degrees    Left Wrist Flexion  90 Degrees    Left Wrist Radial Deviation  32 Degrees    Left Wrist Ulnar Deviation  30 Degrees      Strength   Right Hand Grip (lbs)  22    Right Hand Lateral Pinch  7 lbs    Right Hand 3 Point Pinch  6 lbs    Left Hand Grip (lbs)  45    Left Hand Lateral Pinch  11 lbs    Left Hand 3 Point Pinch  12 lbs      Right Hand AROM   R Thumb Radial ABduction/ADduction 0-55  45    R Thumb Palmar ABduction/ADduction 0-45  45       Measurements taken see above flowsheet for details.   Patient has had more accupuncture over the last week and is doing well.  Pain still about a 4/10.  Decreased pain with thumb flexion but reports pain with thumb extension.   Patient continues to wear her thumb spica for the majority of the time.    After contrast, Manual therapy performed as follows: Joint mobs to UE, Metacarpal and carpal spreads, gentle traction to the wrist.  Graston tool nr 2 performed to her volar forearm, wrist and radial  forearm  Therex: AAROM wrist all planes, thumb radial and palmar abduction then performed actively.    Skin inspected prior to and following ionto tx, no issues noted.    Response to tx:   Patient with decreased pain overall in wrist, 4/10 pain in thumb with extension.  Some increases noted this date with active ROM measurements.  She has responded well to tx and continues to work towards improving ROM and functional use of UE for daily tasks.  Continue to work towards goals in plan of care to increase independence in ADL and IADL tasks.      OT Treatments/Exercises (OP) - 04/13/19 1855      Iontophoresis   Type of Iontophoresis  Dexamethasone    Location  1st dorsal compartment- med patch , 1.5 current     Dose  med patch     Time  19      RUE Contrast Bath   Time  10 minutes    Comments  prior to AROM andPROM             OT Education - 04/14/19 1916    Education Details  exercises, progress    Person(s) Educated  Patient    Methods  Explanation;Demonstration;Tactile cues;Verbal cues;Handout    Comprehension  Returned demonstration;Verbalized understanding;Verbal cues required       OT Short Term Goals - 04/05/19 1724      OT SHORT TERM GOAL #1   Title  Pt to be independent in HEP to wear splint , do HEP and modify using her hand to decrease pain    Status  Achieved      OT SHORT TERM GOAL #2   Title  Pain on PRWHE improve with more than 20 points    Baseline  Eval pain on PRWHE 47/50 - pain improving but still pain with thumb AROM , wrist flexion , and Finkelstein , tenderness over distal radius head    Time  3    Period  Weeks    Status  On-going    Target Date  04/26/19        OT Long Term Goals - 04/14/19  [place] #1   Title  Pain decrease for pt to wean out of splint more than 75% of time    Baseline  3-4/10 pain this date    Time  3    Period  Weeks    Status  On-going      OT LONG TERM GOAL #2   Title  R wrist and thumb  AROM improve to WNL without increase symptoms    Baseline  AROM increase wiht less pain -but pain still thumb PA, RA, opposition ,and wrist flexion    Time  3    Period  Weeks    Status  On-going            Plan - 04/14/19 1931    Clinical Impression Statement  Patient with decreased pain overall in wrist, 4/10 pain in thumb with extension.  Some increases noted this date with active ROM measurements.  She has responded well to tx and continues to work towards improving ROM and functional use of UE for daily tasks.  Continue to work towards goals in plan of care to increase independence in ADL and IADL tasks.    OT Occupational Profile and History  Problem Focused Assessment - Including review of records relating to presenting problem    Occupational performance deficits (Please refer to evaluation for details):  ADL's;IADL's;Leisure;Work;Play;Social Participation    Body Structure / Function / Physical Skills  ADL;Flexibility;ROM;UE functional use;Dexterity;Edema;Pain;Strength;IADL    Rehab Potential  Fair    Clinical Decision Making  Limited treatment options, no task modification necessary    Comorbidities Affecting Occupational Performance:  None    Modification or Assistance to Complete Evaluation   No modification of tasks or assist necessary to complete eval    OT Frequency  2x / week    OT Treatment/Interventions  Self-care/ADL training;Iontophoresis;Therapeutic exercise;Patient/family education;Splinting;Fluidtherapy;Contrast Bath;Manual Therapy    Consulted and Agree with Plan of Care  Patient       Patient will benefit from skilled therapeutic intervention in order to improve the following deficits and impairments:   Body Structure / Function / Physical Skills: ADL, Flexibility, ROM, UE functional use, Dexterity, Edema, Pain, Strength, IADL       Visit Diagnosis: Radial styloid tenosynovitis  Pain in right hand  Pain in right wrist  Stiffness of right wrist, not  elsewhere classified  Stiffness of right hand, not elsewhere classified  Muscle weakness (generalized)    Problem List Patient Active Problem List   Diagnosis Date Noted  . Breast cancer ([place]) 02/02/2017  . Environmental allergies 02/02/2017  . Menopausal syndrome 02/02/2017  . Chronic left hip pain 12/28/2016  . Left upper arm pain 12/28/2016  . Right foot pain 10/26/2016  . Greater trochanteric bursitis of left hip 08/03/2016  . Bilateral hand pain 04/27/2016  . Hyperlipemia   . Cognitive deficits   . Memory loss   . Lung nodule   . Malignant neoplasm of upper lobe of right lung ([place]) 05/27/2014    T , OTR/L, CLT  , 04/14/2019, 7:31 PM  [facility] [address], [place], [address] Phone: [phone]   Fax:  [phone]  Name: Margaret Potts MRN: [id number] Date of Birth: [birth year]

## 2019-04-17 ENCOUNTER — Ambulatory Visit: Payer: Federal, State, Local not specified - PPO | Admitting: Occupational Therapy

## 2019-04-17 ENCOUNTER — Other Ambulatory Visit: Payer: Self-pay

## 2019-04-17 DIAGNOSIS — M654 Radial styloid tenosynovitis [de Quervain]: Secondary | ICD-10-CM

## 2019-04-17 DIAGNOSIS — M25531 Pain in right wrist: Secondary | ICD-10-CM

## 2019-04-17 DIAGNOSIS — M25631 Stiffness of right wrist, not elsewhere classified: Secondary | ICD-10-CM

## 2019-04-17 DIAGNOSIS — M25641 Stiffness of right hand, not elsewhere classified: Secondary | ICD-10-CM

## 2019-04-17 DIAGNOSIS — M6281 Muscle weakness (generalized): Secondary | ICD-10-CM

## 2019-04-17 DIAGNOSIS — M79641 Pain in right hand: Secondary | ICD-10-CM

## 2019-04-17 NOTE — Therapy (Signed)
[facility] [address], [place], [address] Phone: [phone]   Fax:  [phone]  Occupational Therapy Treatment  Patient Details  Name: Margaret Potts MRN: [id number] Date of Birth: 31-Dec-1955 Referring Provider (OT): Arvella Nigh   Encounter Date: 04/17/2019  OT End of Session - 04/17/19 1404    Visit Number  11    Number of Visits  15    Date for OT Re-Evaluation  05/03/19    OT Start Time  1345    OT Stop Time  1435    OT Time Calculation (min)  50 min    Activity Tolerance  Patient tolerated treatment well    Behavior During Therapy  [facility] for tasks assessed/performed       Past Medical History:  Diagnosis Date  . Arthritis   . Breast cancer ([place])    RT with mastectomy in 1995  . Cognitive deficits   . Depression   . Hyperlipemia   . Hypertension   . Lung cancer ([place])    RT upper lobe removed in OCT 2015  . Lung nodule   . Memory loss   . Seizures ([place])     Past Surgical History:  Procedure Laterality Date  . BREAST BIOPSY Left    benign  . BREAST BIOPSY Left 05/27/2017   [place] core ribbon REACTIVE FIBROSIS CONSISTENT WITH SCAR.   Marland Kitchen COLONOSCOPY WITH PROPOFOL N/A 02/09/2017   Procedure: COLONOSCOPY WITH PROPOFOL;  Surgeon: Jonathon Bellows, MD;  Location: [facility];  Service: Gastroenterology;  Laterality: N/A;  . ESOPHAGOGASTRODUODENOSCOPY N/A 02/09/2017   Procedure: ESOPHAGOGASTRODUODENOSCOPY (EGD);  Surgeon: Jonathon Bellows, MD;  Location: [facility];  Service: Gastroenterology;  Laterality: N/A;  . LUNG LOBECTOMY    . MASTECTOMY Right   . POLYPECTOMY N/A 02/09/2017   Procedure: POLYPECTOMY;  Surgeon: Jonathon Bellows, MD;  Location: [facility];  Service: Gastroenterology;  Laterality: N/A;  . right mastectomy      There were no vitals filed for this visit.  Subjective Assessment - 04/17/19 1355    Subjective   It is doing better- I can tell it is getting better- just taking  long time - had the acupuncture and getting it again next Tues - did take the last 2 days some Mobic    Pertinent History  I had my symptoms slowly coming on from January - but gradually pain worsen the last 9 wks - work typing a lot and packages - and love gardening - my first  2 splints was not working - they were to [employer] - just got one yesterday that fits better    Patient Stated Goals  I want my hand and wrist to get better - so I can play gold, garden , working -   I want to retire with not pain down the road    Currently in Pain?  Yes    Pain Score  3     Pain Location  Wrist    Pain Orientation  Right    Pain Descriptors / Indicators  Aching    Pain Type  Acute pain    Pain Onset  More than a month ago        Pt report decrease pain with AROM for wrist and thumb flexion- opposition to base of 5th  And thumb PA and RA AROM - pain end range  Tender over distal radius - decrease to 3/10 and Finkelstein - less pain than last time  per pt   Reinforce again to cont with thumb spica           skin check done and pt to keep on for hour afterwards -   OT Treatments/Exercises (OP) - 04/17/19 0001      Iontophoresis   Type of Iontophoresis  Dexamethasone    Location  1st dorsal compartment- med patch , 1.8 current     Dose  med patch     Time  22      RUE Contrast Bath   Time  9 minutes    Comments  prior to soft tissue mobs      after contrast done soft tissue mobs to webspace , with PA and RA of thumb  MC spreads And graston tool sweeping to volar and dorsal forearm  Prior to [employer]         OT Education - 04/17/19 1356    Education Details  progress and POC    Person(s) Educated  Patient    Methods  Explanation;Demonstration;Tactile cues;Verbal cues;Handout    Comprehension  Returned demonstration;Verbalized understanding;Verbal cues required       OT Short Term Goals - 04/05/19 1724      OT SHORT TERM GOAL #1   Title  Pt to be independent in HEP to wear  splint , do HEP and modify using her hand to decrease pain    Status  Achieved      OT SHORT TERM GOAL #2   Title  Pain on PRWHE improve with more than 20 points    Baseline  Eval pain on PRWHE 47/50 - pain improving but still pain with thumb AROM , wrist flexion , and Finkelstein , tenderness over distal radius head    Time  3    Period  Weeks    Status  On-going    Target Date  04/26/19        OT Long Term Goals - 04/14/19 1930      OT LONG TERM GOAL #1   Title  Pain decrease for pt to wean out of splint more than 75% of time    Baseline  3-4/10 pain this date    Time  3    Period  Weeks    Status  On-going      OT LONG TERM GOAL #2   Title  R wrist and thumb AROM improve to WNL without increase symptoms    Baseline  AROM increase wiht less pain -but pain still thumb PA, RA, opposition ,and wrist flexion    Time  3    Period  Weeks    Status  On-going            Plan - 04/17/19 1405    Clinical Impression Statement  Pt making slow but steady progress- pt declinded dryneedling 4 wks ago - wanted to use her acupuncture - doing ionto in combination since 2 wks ago and cont thumb spica    OT Occupational Profile and History  Problem Focused Assessment - Including review of records relating to presenting problem    Occupational performance deficits (Please refer to evaluation for details):  ADL's;IADL's;Leisure;Work;Play;Social Participation    Body Structure / Function / Physical Skills  ADL;Flexibility;ROM;UE functional use;Dexterity;Edema;Pain;Strength;IADL    Rehab Potential  Fair    Clinical Decision Making  Limited treatment options, no task modification necessary    Comorbidities Affecting Occupational Performance:  None    Modification or Assistance to Complete Evaluation   No modification  of tasks or assist necessary to complete eval    OT Frequency  2x / week    OT Duration  2 weeks    OT Treatment/Interventions  Self-care/ADL training;Iontophoresis;Therapeutic  exercise;Patient/family education;Splinting;Fluidtherapy;Contrast Bath;Manual Therapy    Plan  progress after acupuncture and ionto    OT Home Exercise Plan  see pt instruction    Consulted and Agree with Plan of Care  Patient       Patient will benefit from skilled therapeutic intervention in order to improve the following deficits and impairments:   Body Structure / Function / Physical Skills: ADL, Flexibility, ROM, UE functional use, Dexterity, Edema, Pain, Strength, IADL       Visit Diagnosis: Radial styloid tenosynovitis  Pain in right hand  Pain in right wrist  Stiffness of right wrist, not elsewhere classified  Stiffness of right hand, not elsewhere classified  Muscle weakness (generalized)    Problem List Patient Active Problem List   Diagnosis Date Noted  . Breast cancer ([place]) 02/02/2017  . Environmental allergies 02/02/2017  . Menopausal syndrome 02/02/2017  . Chronic left hip pain 12/28/2016  . Left upper arm pain 12/28/2016  . Right foot pain 10/26/2016  . Greater trochanteric bursitis of left hip 08/03/2016  . Bilateral hand pain 04/27/2016  . Hyperlipemia   . Cognitive deficits   . Memory loss   . Lung nodule   . Malignant neoplasm of upper lobe of right lung ([place]) 05/27/2014    Rosalyn Gess OTR/L,CLT 04/17/2019, 6:11 PM  [facility] [address] [place], [address] Phone: [phone]   Fax:  [phone]  Name: Margaret Potts MRN: [id number] Date of Birth: 1955/10/20

## 2019-04-17 NOTE — Patient Instructions (Signed)
Same

## 2019-04-20 ENCOUNTER — Ambulatory Visit: Payer: Federal, State, Local not specified - PPO | Admitting: Occupational Therapy

## 2019-04-20 ENCOUNTER — Other Ambulatory Visit: Payer: Self-pay

## 2019-04-20 DIAGNOSIS — M25641 Stiffness of right hand, not elsewhere classified: Secondary | ICD-10-CM

## 2019-04-20 DIAGNOSIS — M654 Radial styloid tenosynovitis [de Quervain]: Secondary | ICD-10-CM

## 2019-04-20 DIAGNOSIS — M6281 Muscle weakness (generalized): Secondary | ICD-10-CM

## 2019-04-20 DIAGNOSIS — M25631 Stiffness of right wrist, not elsewhere classified: Secondary | ICD-10-CM

## 2019-04-20 DIAGNOSIS — M25531 Pain in right wrist: Secondary | ICD-10-CM

## 2019-04-20 DIAGNOSIS — M79641 Pain in right hand: Secondary | ICD-10-CM

## 2019-04-20 NOTE — Patient Instructions (Signed)
same

## 2019-04-20 NOTE — Therapy (Signed)
[facility] [address] [place], [address] Phone: [phone]   Fax:  [phone]  Occupational Therapy Treatment  Patient Details  Name: Margaret Potts MRN: [id number] Date of Birth: 11-Jun-1956 Referring Provider (OT): Arvella Nigh   Encounter Date: 04/20/2019  OT End of Session - 04/20/19 0952    Visit Number  12    Number of Visits  15    Date for OT Re-Evaluation  05/03/19    OT Start Time  0915    OT Stop Time  0955    OT Time Calculation (min)  40 min    Activity Tolerance  Patient tolerated treatment well    Behavior During Therapy  [facility] for tasks assessed/performed       Past Medical History:  Diagnosis Date  . Arthritis   . Breast cancer ([place])    RT with mastectomy in 1995  . Cognitive deficits   . Depression   . Hyperlipemia   . Hypertension   . Lung cancer ([place])    RT upper lobe removed in OCT 2015  . Lung nodule   . Memory loss   . Seizures ([place])     Past Surgical History:  Procedure Laterality Date  . BREAST BIOPSY Left    benign  . BREAST BIOPSY Left 05/27/2017   [place] core ribbon REACTIVE FIBROSIS CONSISTENT WITH SCAR.   Marland Kitchen COLONOSCOPY WITH PROPOFOL N/A 02/09/2017   Procedure: COLONOSCOPY WITH PROPOFOL;  Surgeon: Jonathon Bellows, MD;  Location: [facility];  Service: Gastroenterology;  Laterality: N/A;  . ESOPHAGOGASTRODUODENOSCOPY N/A 02/09/2017   Procedure: ESOPHAGOGASTRODUODENOSCOPY (EGD);  Surgeon: Jonathon Bellows, MD;  Location: [facility];  Service: Gastroenterology;  Laterality: N/A;  . LUNG LOBECTOMY    . MASTECTOMY Right   . POLYPECTOMY N/A 02/09/2017   Procedure: POLYPECTOMY;  Surgeon: Jonathon Bellows, MD;  Location: [facility];  Service: Gastroenterology;  Laterality: N/A;  . right mastectomy      There were no vitals filed for this visit.  Subjective Assessment - 04/20/19 0950    Subjective   About the same - the old splint feels better now - I can tolerate it  over that area now -and feels like I have less motion in the old one    Pertinent History  I had my symptoms slowly coming on from January - but gradually pain worsen the last 9 wks - work typing a lot and packages - and love gardening - my first  2 splints was not working - they were to [employer] - just got one yesterday that fits better    Patient Stated Goals  I want my hand and wrist to get better - so I can play gold, garden , working -   I want to retire with not pain down the road    Currently in Pain?  Yes    Pain Score  4     Pain Location  Wrist    Pain Orientation  Right    Pain Descriptors / Indicators  Aching;Tender    Pain Type  Acute pain    Pain Onset  More than a month ago          Pt report decrease pain with AROM for wrist and thumb flexion- opposition to base of 5th  And thumb PA and RA AROM - pain end range  Tender over distal radius -  About 4/10 this date and [name]  Reinforce again to cont with  thumb spica     after some heat done   soft tissue mobs to webspace , with PA and RA of thumb  MC spreads And graston tool sweeping to volar and dorsal forearm  Prior to ionto      skin check done - no issues - pt to keep on for hour afterwards   OT Treatments/Exercises (OP) - 04/20/19 0001      Iontophoresis   Type of Iontophoresis  Dexamethasone    Location  1st dorsal compartment    Dose  med patch , 2.0 current     Time  19             OT Education - 04/20/19 0951    Education Details  progress , splint wearing    Person(s) Educated  Patient    Methods  Explanation;Demonstration;Tactile cues;Verbal cues;Handout    Comprehension  Returned demonstration;Verbalized understanding;Verbal cues required       OT Short Term Goals - 04/05/19 1724      OT SHORT TERM GOAL #1   Title  Pt to be independent in HEP to wear splint , do HEP and modify using her hand to decrease pain    Status  Achieved      OT SHORT TERM GOAL #2   Title  Pain on PRWHE  improve with more than 20 points    Baseline  Eval pain on PRWHE 47/50 - pain improving but still pain with thumb AROM , wrist flexion , and Finkelstein , tenderness over distal radius head    Time  3    Period  Weeks    Status  On-going    Target Date  04/26/19        OT Long Term Goals - 04/14/19 1930      OT LONG TERM GOAL #1   Title  Pain decrease for pt to wean out of splint more than 75% of time    Baseline  3-4/10 pain this date    Time  3    Period  Weeks    Status  On-going      OT LONG TERM GOAL #2   Title  R wrist and thumb AROM improve to WNL without increase symptoms    Baseline  AROM increase wiht less pain -but pain still thumb PA, RA, opposition ,and wrist flexion    Time  3    Period  Weeks    Status  On-going            Plan - 04/20/19 0952    Clinical Impression Statement  Slow but steady progress - reinforce again pt to wear thumb spica - still tender over distal radius head ,pain with thumb RA , Finkelstein positive - but much better since [facility]    OT Occupational Profile and History  Problem Focused Assessment - Including review of records relating to presenting problem    Occupational performance deficits (Please refer to evaluation for details):  ADL's;IADL's;Leisure;Work;Play;Social Participation    Body Structure / Function / Physical Skills  ADL;Flexibility;ROM;UE functional use;Dexterity;Edema;Pain;Strength;IADL    Rehab Potential  Fair    Clinical Decision Making  Limited treatment options, no task modification necessary    Comorbidities Affecting Occupational Performance:  None    Modification or Assistance to Complete Evaluation   No modification of tasks or assist necessary to complete eval    OT Frequency  2x / week    OT Duration  2 weeks    OT Treatment/Interventions  Self-care/ADL training;Iontophoresis;Therapeutic exercise;Patient/family education;Splinting;Fluidtherapy;Contrast Bath;Manual Therapy    Plan  progress after acupuncture and  ionto    OT Home Exercise Plan  see pt instruction    Consulted and Agree with Plan of Care  Patient       Patient will benefit from skilled therapeutic intervention in order to improve the following deficits and impairments:   Body Structure / Function / Physical Skills: ADL, Flexibility, ROM, UE functional use, Dexterity, Edema, Pain, Strength, IADL       Visit Diagnosis: Radial styloid tenosynovitis  Pain in right hand  Pain in right wrist  Stiffness of right wrist, not elsewhere classified  Stiffness of right hand, not elsewhere classified  Muscle weakness (generalized)    Problem List Patient Active Problem List   Diagnosis Date Noted  . Breast cancer ([place]) 02/02/2017  . Environmental allergies 02/02/2017  . Menopausal syndrome 02/02/2017  . Chronic left hip pain 12/28/2016  . Left upper arm pain 12/28/2016  . Right foot pain 10/26/2016  . Greater trochanteric bursitis of left hip 08/03/2016  . Bilateral hand pain 04/27/2016  . Hyperlipemia   . Cognitive deficits   . Memory loss   . Lung nodule   . Malignant neoplasm of upper lobe of right lung ([place]) 05/27/2014    Rosalyn Gess OTR/L,CLT 04/20/2019, 10:04 AM  [facility] [address], [place], [address] Phone: [phone]   Fax:  [phone]  Name: Margaret Potts MRN: [id number] Date of Birth: 20-Mar-1956

## 2019-04-27 ENCOUNTER — Ambulatory Visit: Payer: Federal, State, Local not specified - PPO | Admitting: Occupational Therapy

## 2019-04-27 ENCOUNTER — Other Ambulatory Visit: Payer: Self-pay

## 2019-04-27 DIAGNOSIS — M25631 Stiffness of right wrist, not elsewhere classified: Secondary | ICD-10-CM

## 2019-04-27 DIAGNOSIS — M654 Radial styloid tenosynovitis [de Quervain]: Secondary | ICD-10-CM | POA: Diagnosis not present

## 2019-04-27 DIAGNOSIS — M6281 Muscle weakness (generalized): Secondary | ICD-10-CM

## 2019-04-27 DIAGNOSIS — M25641 Stiffness of right hand, not elsewhere classified: Secondary | ICD-10-CM

## 2019-04-27 DIAGNOSIS — M25531 Pain in right wrist: Secondary | ICD-10-CM

## 2019-04-27 DIAGNOSIS — M79641 Pain in right hand: Secondary | ICD-10-CM

## 2019-04-27 NOTE — Therapy (Signed)
[facility] [address] [place], [address] Phone: [phone]   Fax:  [phone]  Occupational Therapy Treatment  Patient Details  Name: Margaret Potts MRN: [id number] Date of Birth: 05-06-1956 Referring Provider (OT): Arvella Nigh   Encounter Date: 04/27/2019  OT End of Session - 04/27/19 1337    Visit Number  13    Number of Visits  15    Date for OT Re-Evaluation  05/03/19    OT Start Time  1050    OT Stop Time  1135    OT Time Calculation (min)  45 min    Activity Tolerance  Patient tolerated treatment well    Behavior During Therapy  [facility] for tasks assessed/performed       Past Medical History:  Diagnosis Date  . Arthritis   . Breast cancer ([place])    RT with mastectomy in 1995  . Cognitive deficits   . Depression   . Hyperlipemia   . Hypertension   . Lung cancer ([place])    RT upper lobe removed in OCT 2015  . Lung nodule   . Memory loss   . Seizures ([place])     Past Surgical History:  Procedure Laterality Date  . BREAST BIOPSY Left    benign  . BREAST BIOPSY Left 05/27/2017   [place] core ribbon REACTIVE FIBROSIS CONSISTENT WITH SCAR.   Marland Kitchen COLONOSCOPY WITH PROPOFOL N/A 02/09/2017   Procedure: COLONOSCOPY WITH PROPOFOL;  Surgeon: Jonathon Bellows, MD;  Location: [facility];  Service: Gastroenterology;  Laterality: N/A;  . ESOPHAGOGASTRODUODENOSCOPY N/A 02/09/2017   Procedure: ESOPHAGOGASTRODUODENOSCOPY (EGD);  Surgeon: Jonathon Bellows, MD;  Location: [facility];  Service: Gastroenterology;  Laterality: N/A;  . LUNG LOBECTOMY    . MASTECTOMY Right   . POLYPECTOMY N/A 02/09/2017   Procedure: POLYPECTOMY;  Surgeon: Jonathon Bellows, MD;  Location: [facility];  Service: Gastroenterology;  Laterality: N/A;  . right mastectomy      There were no vitals filed for this visit.  Subjective Assessment - 04/27/19 1319    Subjective   I have been exercising my thumb and wrist - and using this black  splint the last few wks - pain 3-4/10 but by end of day increase- appt after you with acupuncture    Pertinent History  I had my symptoms slowly coming on from January - but gradually pain worsen the last 9 wks - work typing a lot and packages - and love gardening - my first  2 splints was not working - they were to [employer] - just got one yesterday that fits better    Patient Stated Goals  I want my hand and wrist to get better - so I can play gold, garden , working -   I want to retire with not pain down the road    Currently in Pain?  Yes    Pain Score  4     Pain Location  Wrist    Pain Orientation  Right    Pain Descriptors / Indicators  Aching;Tender    Pain Type  Acute pain    Pain Onset  More than a month ago       Pt report pain do increase as the day goes - ? If she takes splint off more and use her hand and also wearing her prefab splint more - but allowing no enough immobilization  Ed pt to wear custom splint more   Tender over distal  radius -  About 4/10 this date and [name]  Reinforce again to cont with thumb spica custom    after contrast    soft tissue mobs to webspace , with PA and RA of thumb  MC spreads And graston tool sweeping to volar and dorsal forearm Prior to ionto      skin check done - no issues - pt to keep on for hour afterwards             OT Treatments/Exercises (OP) - 04/27/19 0001      Iontophoresis   Type of Iontophoresis  Dexamethasone    Location  1st dorsal compartment    Dose  med patch , 2.0 current     Time  19      RUE Contrast Bath   Time  9 minutes    Comments  to wrist, hand and forearm - prior to soft tissue              OT Education - 04/27/19 1337    Education Details  progress , splint wearing    Person(s) Educated  Patient    Methods  Explanation;Demonstration;Tactile cues;Verbal cues;Handout    Comprehension  Returned demonstration;Verbalized understanding;Verbal cues required       OT Short  Term Goals - 04/05/19 1724      OT SHORT TERM GOAL #1   Title  Pt to be independent in HEP to wear splint , do HEP and modify using her hand to decrease pain    Status  Achieved      OT SHORT TERM GOAL #2   Title  Pain on PRWHE improve with more than 20 points    Baseline  Eval pain on PRWHE 47/50 - pain improving but still pain with thumb AROM , wrist flexion , and Finkelstein , tenderness over distal radius head    Time  3    Period  Weeks    Status  On-going    Target Date  04/26/19        OT Long Term Goals - 04/14/19 1930      OT LONG TERM GOAL #1   Title  Pain decrease for pt to wean out of splint more than 75% of time    Baseline  3-4/10 pain this date    Time  3    Period  Weeks    Status  On-going      OT LONG TERM GOAL #2   Title  R wrist and thumb AROM improve to WNL without increase symptoms    Baseline  AROM increase wiht less pain -but pain still thumb PA, RA, opposition ,and wrist flexion    Time  3    Period  Weeks    Status  On-going            Plan - 04/27/19 1337    Clinical Impression Statement  Pt was taking her splint off and exercising thumb , and using her prefab thumb spica that allows more movement - reinforce with pt again to wear custom that immobilze her thumb and wrist the best -and only off 2 x day and ADL's    OT Occupational Profile and History  Problem Focused Assessment - Including review of records relating to presenting problem    Occupational performance deficits (Please refer to evaluation for details):  ADL's;IADL's;Leisure;Work;Play;Social Participation    Body Structure / Function / Physical Skills  ADL;Flexibility;ROM;UE functional use;Dexterity;Edema;Pain;Strength;IADL    Rehab Potential  Fair  Clinical Decision Making  Limited treatment options, no task modification necessary    Comorbidities Affecting Occupational Performance:  None    Modification or Assistance to Complete Evaluation   No modification of tasks or assist  necessary to complete eval    OT Frequency  2x / week    OT Duration  2 weeks    OT Treatment/Interventions  Self-care/ADL training;Iontophoresis;Therapeutic exercise;Patient/family education;Splinting;Fluidtherapy;Contrast Bath;Manual Therapy    Plan  progress after acupuncture and ionto    OT Home Exercise Plan  see pt instruction    Consulted and Agree with Plan of Care  Patient       Patient will benefit from skilled therapeutic intervention in order to improve the following deficits and impairments:   Body Structure / Function / Physical Skills: ADL, Flexibility, ROM, UE functional use, Dexterity, Edema, Pain, Strength, IADL       Visit Diagnosis: Radial styloid tenosynovitis  Pain in right hand  Pain in right wrist  Stiffness of right wrist, not elsewhere classified  Stiffness of right hand, not elsewhere classified  Muscle weakness (generalized)    Problem List Patient Active Problem List   Diagnosis Date Noted  . Breast cancer ([place]) 02/02/2017  . Environmental allergies 02/02/2017  . Menopausal syndrome 02/02/2017  . Chronic left hip pain 12/28/2016  . Left upper arm pain 12/28/2016  . Right foot pain 10/26/2016  . Greater trochanteric bursitis of left hip 08/03/2016  . Bilateral hand pain 04/27/2016  . Hyperlipemia   . Cognitive deficits   . Memory loss   . Lung nodule   . Malignant neoplasm of upper lobe of right lung ([place]) 05/27/2014    Demone Lyles OTR/l,CLT 04/27/2019, 1:44 PM  [facility] [address], [place], [address] Phone: [phone]   Fax:  [phone]  Name: Margaret Potts MRN: [id number] Date of Birth: 06-Oct-1955

## 2019-04-27 NOTE — Patient Instructions (Signed)
Use thumb spica that immobilize her  the best  And only do pain free AROM to thumb and wrist 2 x day  And off for ADL's only

## 2019-05-04 ENCOUNTER — Other Ambulatory Visit: Payer: Self-pay

## 2019-05-04 ENCOUNTER — Ambulatory Visit: Payer: Federal, State, Local not specified - PPO | Admitting: Occupational Therapy

## 2019-05-04 DIAGNOSIS — M654 Radial styloid tenosynovitis [de Quervain]: Secondary | ICD-10-CM

## 2019-05-04 DIAGNOSIS — M6281 Muscle weakness (generalized): Secondary | ICD-10-CM

## 2019-05-04 DIAGNOSIS — M25531 Pain in right wrist: Secondary | ICD-10-CM

## 2019-05-04 DIAGNOSIS — M25641 Stiffness of right hand, not elsewhere classified: Secondary | ICD-10-CM

## 2019-05-04 DIAGNOSIS — M25631 Stiffness of right wrist, not elsewhere classified: Secondary | ICD-10-CM

## 2019-05-04 DIAGNOSIS — M79641 Pain in right hand: Secondary | ICD-10-CM

## 2019-05-04 NOTE — Patient Instructions (Signed)
same

## 2019-05-04 NOTE — Therapy (Signed)
[facility] [address] [place], [address] Phone: [phone]   Fax:  [phone]  Occupational Therapy Treatment  Patient Details  Name: Margaret Potts MRN: [id number] Date of Birth: 1956/02/16 Referring Provider (OT): Arvella Nigh   Encounter Date: 05/04/2019  OT End of Session - 05/04/19 1312    Visit Number  14    Number of Visits  18    Date for OT Re-Evaluation  06/01/19    OT Start Time  0900    OT Stop Time  0946    OT Time Calculation (min)  46 min    Activity Tolerance  Patient tolerated treatment well    Behavior During Therapy  [facility] for tasks assessed/performed       Past Medical History:  Diagnosis Date  . Arthritis   . Breast cancer ([place])    RT with mastectomy in 1995  . Cognitive deficits   . Depression   . Hyperlipemia   . Hypertension   . Lung cancer ([place])    RT upper lobe removed in OCT 2015  . Lung nodule   . Memory loss   . Seizures ([place])     Past Surgical History:  Procedure Laterality Date  . BREAST BIOPSY Left    benign  . BREAST BIOPSY Left 05/27/2017   [place] core ribbon REACTIVE FIBROSIS CONSISTENT WITH SCAR.   Marland Kitchen COLONOSCOPY WITH PROPOFOL N/A 02/09/2017   Procedure: COLONOSCOPY WITH PROPOFOL;  Surgeon: Jonathon Bellows, MD;  Location: [place];  Service: Gastroenterology;  Laterality: N/A;  . ESOPHAGOGASTRODUODENOSCOPY N/A 02/09/2017   Procedure: ESOPHAGOGASTRODUODENOSCOPY (EGD);  Surgeon: Jonathon Bellows, MD;  Location: [place];  Service: Gastroenterology;  Laterality: N/A;  . LUNG LOBECTOMY    . MASTECTOMY Right   . POLYPECTOMY N/A 02/09/2017   Procedure: POLYPECTOMY;  Surgeon: Jonathon Bellows, MD;  Location: [place];  Service: Gastroenterology;  Laterality: N/A;  . right mastectomy      There were no vitals filed for this visit.  Subjective Assessment - 05/04/19 0916    Subjective   It is getting better - progress in the beginning was faster but now  getting better but slower - did had acupuncture last Friday after seeing you    Pertinent History  I had my symptoms slowly coming on from January - but gradually pain worsen the last 9 wks - work typing a lot and packages - and love gardening - my first  2 splints was not working - they were to [employer] - just got one yesterday that fits better    Patient Stated Goals  I want my hand and wrist to get better - so I can play gold, garden , working -   I want to retire with not pain down the road    Currently in Pain?  Yes    Pain Score  3     Pain Location  Wrist    Pain Orientation  Right    Pain Descriptors / Indicators  Aching;Tender    Pain Type  Acute pain         [facility] OT Assessment - 05/04/19 0001      Strength   Right Hand Grip (lbs)  25    Right Hand Lateral Pinch  6 lbs    Right Hand 3 Point Pinch  4 lbs    Left Hand Grip (lbs)  45    Left Hand Lateral Pinch  11 lbs  Left Hand 3 Point Pinch  12 lbs      Measurements - grip improved again - but still pain and decrease    Pt report pain do increase as the day goes - ? If she takes splint off more and use her hand and  Writing a lot at work with R  She did switch since last time to custom for better immobilization  Ed pt to wear custom splint  Tender over distal radius - About 3-4/10 this date and [employer] again to cont with thumb spica custom    after contrast  soft tissue mobs to webspace , with PA and RA of thumb  MC spreads And graston tool sweeping to volar and dorsal forearm Prior to ionto     skin check done - no issues - pt to keep on for hour afterwards        OT Treatments/Exercises (OP) - 05/04/19 0001      Iontophoresis   Type of Iontophoresis  Dexamethasone    Location  1st dorsal compartment    Dose  med patch , 2.0 current     Time  19      RUE Contrast Bath   Time  9 minutes    Comments  wrist and hand prior to soft tissue              OT Education -  05/04/19 1312    Education Details  splint wearing    Person(s) Educated  Patient    Methods  Explanation;Demonstration;Tactile cues;Verbal cues;Handout    Comprehension  Returned demonstration;Verbalized understanding;Verbal cues required       OT Short Term Goals - 05/04/19 1315      OT SHORT TERM GOAL #1   Baseline  pain 10/10 at eval - could not touch it - now 3/10    Status  Achieved      OT SHORT TERM GOAL #2   Title  Pain on PRWHE improve with more than 20 points    Baseline  Eval pain on PRWHE 47/50 -10/10 -  pain improving but still pain with thumb AROM , wrist flexion , and Finkelstein , tenderness over distal radius head3-4/10    Time  3    Period  Weeks    Status  On-going    Target Date  05/25/19        OT Long Term Goals - 05/04/19 1317      OT LONG TERM GOAL #1   Title  Pain decrease for pt to wean out of splint more than 75% of time    Baseline  3-4/10 pain this date - cannot wean out of it yet    Time  3    Period  Weeks    Status  On-going    Target Date  05/25/19      OT LONG TERM GOAL #2   Title  R wrist and thumb AROM improve to WNL without increase symptoms    Baseline  AROM  and grip increased with less pain -but pain 3-4/10 still thumb PA, RA, opposition ,and wrist flexion    Time  3    Period  Weeks    Status  On-going    Target Date  05/25/19            Plan - 05/04/19 1313    Clinical Impression Statement  Pt made great progress from [facility] - reinfoce last time to wear splint most all the time -  was taking it off and doing exercises to thumb and wrist - pt do use her hand for writing at work - discuss with pt to think about surgery and /or US guided shot - but she wants to avoid surgery and wants to do her acupumcture in conjunction with OT    OT Occupational Profile and History  Problem Focused Assessment - Including review of records relating to presenting problem    Occupational performance deficits (Please refer to evaluation for  details):  ADL's;IADL's;Leisure;Work;Play;Social Participation    Body Structure / Function / Physical Skills  ADL;Flexibility;ROM;UE functional use;Dexterity;Edema;Pain;Strength;IADL    Rehab Potential  Fair    Clinical Decision Making  Limited treatment options, no task modification necessary    Comorbidities Affecting Occupational Performance:  None    Modification or Assistance to Complete Evaluation   No modification of tasks or assist necessary to complete eval    OT Frequency  1x / week    OT Duration  4 weeks    OT Treatment/Interventions  Self-care/ADL training;Iontophoresis;Therapeutic exercise;Patient/family education;Splinting;Fluidtherapy;Contrast Bath;Manual Therapy    Plan  progress after acupuncture and ionto    OT Home Exercise Plan  see pt instruction    Consulted and Agree with Plan of Care  Patient       Patient will benefit from skilled therapeutic intervention in order to improve the following deficits and impairments:   Body Structure / Function / Physical Skills: ADL, Flexibility, ROM, UE functional use, Dexterity, Edema, Pain, Strength, IADL       Visit Diagnosis: Radial styloid tenosynovitis - Plan: Ot plan of care cert/re-cert  Pain in right hand - Plan: Ot plan of care cert/re-cert  Pain in right wrist - Plan: Ot plan of care cert/re-cert  Stiffness of right wrist, not elsewhere classified - Plan: Ot plan of care cert/re-cert  Stiffness of right hand, not elsewhere classified - Plan: Ot plan of care cert/re-cert  Muscle weakness (generalized) - Plan: Ot plan of care cert/re-cert    Problem List Patient Active Problem List   Diagnosis Date Noted  . Breast cancer ([place]) 02/02/2017  . Environmental allergies 02/02/2017  . Menopausal syndrome 02/02/2017  . Chronic left hip pain 12/28/2016  . Left upper arm pain 12/28/2016  . Right foot pain 10/26/2016  . Greater trochanteric bursitis of left hip 08/03/2016  . Bilateral hand pain 04/27/2016  .  Hyperlipemia   . Cognitive deficits   . Memory loss   . Lung nodule   . Malignant neoplasm of upper lobe of right lung ([place]) 05/27/2014    Hellon Vaccarella, Gwenette Greet OTR/L,CLT 05/04/2019, 1:22 PM  [facility] [address] [place], [address] Phone: [phone]   Fax:  [phone]  Name: Margaret Potts MRN: [id number] Date of Birth: 1956/03/02

## 2019-05-09 ENCOUNTER — Ambulatory Visit: Payer: Federal, State, Local not specified - PPO | Admitting: Occupational Therapy

## 2019-05-09 ENCOUNTER — Other Ambulatory Visit: Payer: Self-pay

## 2019-05-09 DIAGNOSIS — M79641 Pain in right hand: Secondary | ICD-10-CM

## 2019-05-09 DIAGNOSIS — M654 Radial styloid tenosynovitis [de Quervain]: Secondary | ICD-10-CM | POA: Diagnosis not present

## 2019-05-09 DIAGNOSIS — M25531 Pain in right wrist: Secondary | ICD-10-CM

## 2019-05-09 DIAGNOSIS — M6281 Muscle weakness (generalized): Secondary | ICD-10-CM

## 2019-05-09 DIAGNOSIS — M25631 Stiffness of right wrist, not elsewhere classified: Secondary | ICD-10-CM

## 2019-05-09 DIAGNOSIS — M25641 Stiffness of right hand, not elsewhere classified: Secondary | ICD-10-CM

## 2019-05-09 NOTE — Patient Instructions (Signed)
same

## 2019-05-09 NOTE — Therapy (Signed)
[facility] [address], [place], [address] Phone: [phone]   Fax:  [phone]  Occupational Therapy Treatment  Patient Details  Name: Margaret Potts MRN: [id number] Date of Birth: February 05, 1956 Referring Provider (OT): Margaret Potts   Encounter Date: 05/09/2019  OT End of Session - 05/09/19 1547    Visit Number  15    Number of Visits  18    Date for OT Re-Evaluation  06/01/19    OT Start Time  1545    OT Stop Time  1647    OT Time Calculation (min)  62 min    Activity Tolerance  Patient tolerated treatment well    Behavior During Therapy  [facility] for tasks assessed/performed       Past Medical History:  Diagnosis Date  . Arthritis   . Breast cancer ([place])    RT with mastectomy in 1995  . Cognitive deficits   . Depression   . Hyperlipemia   . Hypertension   . Lung cancer ([place])    RT upper lobe removed in OCT 2015  . Lung nodule   . Memory loss   . Seizures ([place])     Past Surgical History:  Procedure Laterality Date  . BREAST BIOPSY Left    benign  . BREAST BIOPSY Left 05/27/2017   [place] core ribbon REACTIVE FIBROSIS CONSISTENT WITH SCAR.   Marland Kitchen COLONOSCOPY WITH PROPOFOL N/A 02/09/2017   Procedure: COLONOSCOPY WITH PROPOFOL;  Surgeon: Jonathon Bellows, MD;  Location: [place];  Service: Gastroenterology;  Laterality: N/A;  . ESOPHAGOGASTRODUODENOSCOPY N/A 02/09/2017   Procedure: ESOPHAGOGASTRODUODENOSCOPY (EGD);  Surgeon: Jonathon Bellows, MD;  Location: [place];  Service: Gastroenterology;  Laterality: N/A;  . LUNG LOBECTOMY    . MASTECTOMY Right   . POLYPECTOMY N/A 02/09/2017   Procedure: POLYPECTOMY;  Surgeon: Jonathon Bellows, MD;  Location: [place];  Service: Gastroenterology;  Laterality: N/A;  . right mastectomy      There were no vitals filed for this visit.  Subjective Assessment - 05/09/19 1546    Subjective   It is better - I really tried to not to use it like writing and  holding things with the splint on    Pertinent History  I had my symptoms slowly coming on from January - but gradually pain worsen the last 9 wks - work typing a lot and packages - and love gardening - my first  2 splints was not working - they were to [employer] - just got one yesterday that fits better    Patient Stated Goals  I want my hand and wrist to get better - so I can play gold, garden , working -   I want to retire with not pain down the road    Currently in Pain?  Yes    Pain Score  2     Pain Location  Wrist    Pain Orientation  Right    Pain Descriptors / Indicators  Tender    Pain Type  Acute pain    Pain Onset  More than a month ago           Pt reportpain do increase as the day goes - ? If she takes splint off more and use her hand and  Writing a lot at work with R  She did switch since last time to custom for better immobilization and doing better Ed pt to wear custom splint Tender over distal radius -  About 2/10 this date and [name] no pain  Reinforce again to cont with thumb spicacustom    aftercontrast soft tissue mobs to webspace , with PA and RA of thumb  MC spreads And graston tool sweeping to volar and dorsal forearm Prior to ionto- did make her skin more tender this date      skin check done afterwards - had tiny blister           OT Treatments/Exercises (OP) - 05/09/19 0001      Iontophoresis   Type of Iontophoresis  Dexamethasone    Location  1st dorsal compartment    Dose  med patch , 1.0 current     Time  46      RUE Contrast Bath   Time  9 minutes    Comments  wrist and hand prior to soft tissue             OT Education - 05/09/19 1547    Education Details  spllint wearing    Person(s) Educated  Patient    Methods  Explanation;Demonstration;Tactile cues;Verbal cues;Handout    Comprehension  Returned demonstration;Verbalized understanding;Verbal cues required       OT Short Term Goals - 05/04/19 1315       OT SHORT TERM GOAL #1   Baseline  pain 10/10 at eval - could not touch it - now 3/10    Status  Achieved      OT SHORT TERM GOAL #2   Title  Pain on PRWHE improve with more than 20 points    Baseline  Eval pain on PRWHE 47/50 -10/10 -  pain improving but still pain with thumb AROM , wrist flexion , and Finkelstein , tenderness over distal radius head3-4/10    Time  3    Period  Weeks    Status  On-going    Target Date  05/25/19        OT Long Term Goals - 05/04/19 1317      OT LONG TERM GOAL #1   Title  Pain decrease for pt to wean out of splint more than 75% of time    Baseline  3-4/10 pain this date - cannot wean out of it yet    Time  3    Period  Weeks    Status  On-going    Target Date  05/25/19      OT LONG TERM GOAL #2   Title  R wrist and thumb AROM improve to WNL without increase symptoms    Baseline  AROM  and grip increased with less pain -but pain 3-4/10 still thumb PA, RA, opposition ,and wrist flexion    Time  3    Period  Weeks    Status  On-going    Target Date  05/25/19            Plan - 05/09/19 1644    Clinical Impression Statement  Pt is making progress with tenderenss over 1st dorsal compartment - 2/10 this date - and trying to not use her hand with splint on as much at work helped - cont with ionto , splinting , ice , HEP and acupumcture    OT Occupational Profile and History  Problem Focused Assessment - Including review of records relating to presenting problem    Occupational performance deficits (Please refer to evaluation for details):  ADL's;IADL's;Leisure;Work;Play;Social Participation    Body Structure / Function / Physical Skills  ADL;Flexibility;ROM;UE functional use;Dexterity;Edema;Pain;Strength;IADL    Rehab  Potential  Fair    Clinical Decision Making  Limited treatment options, no task modification necessary    Comorbidities Affecting Occupational Performance:  None    Modification or Assistance to Complete Evaluation   No  modification of tasks or assist necessary to complete eval    OT Frequency  1x / week    OT Duration  4 weeks    OT Treatment/Interventions  Self-care/ADL training;Iontophoresis;Therapeutic exercise;Patient/family education;Splinting;Fluidtherapy;Contrast Bath;Manual Therapy    Plan  progress after acupuncture and ionto    OT Home Exercise Plan  see pt instruction    Consulted and Agree with Plan of Care  Patient       Patient will benefit from skilled therapeutic intervention in order to improve the following deficits and impairments:   Body Structure / Function / Physical Skills: ADL, Flexibility, ROM, UE functional use, Dexterity, Edema, Pain, Strength, IADL       Visit Diagnosis: Pain in right hand  Pain in right wrist  Radial styloid tenosynovitis  Stiffness of right wrist, not elsewhere classified  Stiffness of right hand, not elsewhere classified  Muscle weakness (generalized)    Problem List Patient Active Problem List   Diagnosis Date Noted  . Breast cancer ([place]) 02/02/2017  . Environmental allergies 02/02/2017  . Menopausal syndrome 02/02/2017  . Chronic left hip pain 12/28/2016  . Left upper arm pain 12/28/2016  . Right foot pain 10/26/2016  . Greater trochanteric bursitis of left hip 08/03/2016  . Bilateral hand pain 04/27/2016  . Hyperlipemia   . Cognitive deficits   . Memory loss   . Lung nodule   . 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, [place], [address] Phone: [phone]   Fax:  [phone]  Name: Margaret Potts MRN: [id number] Date of Birth: March 03, 1956

## 2019-05-18 ENCOUNTER — Ambulatory Visit: Payer: Federal, State, Local not specified - PPO | Attending: Orthopedic Surgery | Admitting: Occupational Therapy

## 2019-05-18 ENCOUNTER — Other Ambulatory Visit: Payer: Self-pay

## 2019-05-18 DIAGNOSIS — M654 Radial styloid tenosynovitis [de Quervain]: Secondary | ICD-10-CM | POA: Insufficient documentation

## 2019-05-18 DIAGNOSIS — M79641 Pain in right hand: Secondary | ICD-10-CM | POA: Diagnosis present

## 2019-05-18 DIAGNOSIS — M6281 Muscle weakness (generalized): Secondary | ICD-10-CM | POA: Insufficient documentation

## 2019-05-18 DIAGNOSIS — M25531 Pain in right wrist: Secondary | ICD-10-CM | POA: Insufficient documentation

## 2019-05-18 DIAGNOSIS — M25631 Stiffness of right wrist, not elsewhere classified: Secondary | ICD-10-CM | POA: Diagnosis present

## 2019-05-18 DIAGNOSIS — M25641 Stiffness of right hand, not elsewhere classified: Secondary | ICD-10-CM | POA: Insufficient documentation

## 2019-05-18 NOTE — Patient Instructions (Signed)
Same but can start UD of hand and wrist - with thumb static -if pain free

## 2019-05-18 NOTE — Therapy (Signed)
[facility] [address], [place], [address] Phone: [phone]   Fax:  [phone]  Occupational Therapy Treatment  Patient Details  Name: Margaret Potts MRN: [id number] Date of Birth: 1956/01/30 Referring Provider (OT): Arvella Nigh   Encounter Date: 05/18/2019  OT End of Session - 05/18/19 0843    Visit Number  16    Number of Visits  18    Date for OT Re-Evaluation  06/01/19    OT Start Time  0830    OT Stop Time  0921    OT Time Calculation (min)  51 min    Activity Tolerance  Patient tolerated treatment well    Behavior During Therapy  [facility] for tasks assessed/performed       Past Medical History:  Diagnosis Date  . Arthritis   . Breast cancer ([place])    RT with mastectomy in 1995  . Cognitive deficits   . Depression   . Hyperlipemia   . Hypertension   . Lung cancer ([place])    RT upper lobe removed in OCT 2015  . Lung nodule   . Memory loss   . Seizures ([place])     Past Surgical History:  Procedure Laterality Date  . BREAST BIOPSY Left    benign  . BREAST BIOPSY Left 05/27/2017   [place] core ribbon REACTIVE FIBROSIS CONSISTENT WITH SCAR.   Marland Kitchen COLONOSCOPY WITH PROPOFOL N/A 02/09/2017   Procedure: COLONOSCOPY WITH PROPOFOL;  Surgeon: Jonathon Bellows, MD;  Location: [place];  Service: Gastroenterology;  Laterality: N/A;  . ESOPHAGOGASTRODUODENOSCOPY N/A 02/09/2017   Procedure: ESOPHAGOGASTRODUODENOSCOPY (EGD);  Surgeon: Jonathon Bellows, MD;  Location: [place];  Service: Gastroenterology;  Laterality: N/A;  . LUNG LOBECTOMY    . MASTECTOMY Right   . POLYPECTOMY N/A 02/09/2017   Procedure: POLYPECTOMY;  Surgeon: Jonathon Bellows, MD;  Location: [place];  Service: Gastroenterology;  Laterality: N/A;  . right mastectomy      There were no vitals filed for this visit.  Subjective Assessment - 05/18/19 0838    Subjective   My hand is still gettting better - still wearing splint - I can tell  I was off for 4 days and it feels better then    Pertinent History  I had my symptoms slowly coming on from January - but gradually pain worsen the last 9 wks - work typing a lot and packages - and love gardening - my first  2 splints was not working - they were to [employer] - just got one yesterday that fits better    Patient Stated Goals  I want my hand and wrist to get better - so I can play gold, garden , working -   I want to retire with not pain down the road    Currently in Pain?  Yes    Pain Score  2     Pain Location  Wrist    Pain Orientation  Right    Pain Descriptors / Indicators  Aching;Tender    Pain Type  Acute pain    Pain Onset  More than a month ago          Pt report pain better - and only 2/10 with combination AROM for pinch with wrist motion  And she did not work for 4 wks - then her pain is better too She is wearing her custom thumb spica  And doing some pain free AROM in shower  Seen her  acupuncturist - and report some of her pain at rest is more dorsal on wrist but with thumb RA , pinch and wrist flexion - it is still over 1st dorsal compartment  As well as tenderness  [name] negative this date          OT Treatments/Exercises (OP) - 05/18/19 0001      Iontophoresis   Type of Iontophoresis  Dexamethasone    Location  1st dorsal compartment      RUE Contrast Bath   Time  9 minutes    Comments  wrist and hand at [facility]       after contrast cone some graston sweeping over dorsal and volar forearm and wrist using tool nr 2  Pain with resistance for thumb RA , ADD  But could do UD of digits and wrist with thumb static - add to HEP   also to do at home scapula squeezes and rowing exercises      OT Education - 05/18/19 0843    Education Details  progress and POC    Person(s) Educated  Patient    Methods  Explanation;Demonstration;Tactile cues;Verbal cues;Handout    Comprehension  Returned demonstration;Verbalized understanding;Verbal cues required        OT Short Term Goals - 05/04/19 1315      OT SHORT TERM GOAL #1   Baseline  pain 10/10 at eval - could not touch it - now 3/10    Status  Achieved      OT SHORT TERM GOAL #2   Title  Pain on PRWHE improve with more than 20 points    Baseline  Eval pain on PRWHE 47/50 -10/10 -  pain improving but still pain with thumb AROM , wrist flexion , and Finkelstein , tenderness over distal radius head3-4/10    Time  3    Period  Weeks    Status  On-going    Target Date  05/25/19        OT Long Term Goals - 05/04/19 1317      OT LONG TERM GOAL #1   Title  Pain decrease for pt to wean out of splint more than 75% of time    Baseline  3-4/10 pain this date - cannot wean out of it yet    Time  3    Period  Weeks    Status  On-going    Target Date  05/25/19      OT LONG TERM GOAL #2   Title  R wrist and thumb AROM improve to WNL without increase symptoms    Baseline  AROM  and grip increased with less pain -but pain 3-4/10 still thumb PA, RA, opposition ,and wrist flexion    Time  3    Period  Weeks    Status  On-going    Target Date  05/25/19            Plan - 05/18/19 0844    Clinical Impression Statement  Pt cont to make slow but steady progress- her symptoms at [facility] was 10/10 , swelling and could not tolerate any touching or pressure - for while prior to therapy was not wearing splint - and then during therapy she also for couple of weeks wore prefab and doing more motion that she should - but last few wks progressing -and doing acupuncture    OT Occupational Profile and History  Problem Focused Assessment - Including review of records relating to presenting problem    Occupational  performance deficits (Please refer to evaluation for details):  ADL's;IADL's;Leisure;Work;Play;Social Participation    Body Structure / Function / Physical Skills  ADL;Flexibility;ROM;UE functional use;Dexterity;Edema;Pain;Strength;IADL    Rehab Potential  Fair    Clinical Decision Making   Limited treatment options, no task modification necessary    Comorbidities Affecting Occupational Performance:  None    Modification or Assistance to Complete Evaluation   No modification of tasks or assist necessary to complete eval    OT Frequency  1x / week    OT Duration  4 weeks    OT Treatment/Interventions  Self-care/ADL training;Iontophoresis;Therapeutic exercise;Patient/family education;Splinting;Fluidtherapy;Contrast Bath;Manual Therapy    Plan  progress after acupuncture and ionto    OT Home Exercise Plan  see pt instruction    Consulted and Agree with Plan of Care  Patient       Patient will benefit from skilled therapeutic intervention in order to improve the following deficits and impairments:   Body Structure / Function / Physical Skills: ADL, Flexibility, ROM, UE functional use, Dexterity, Edema, Pain, Strength, IADL       Visit Diagnosis: Pain in right wrist  Radial styloid tenosynovitis  Stiffness of right wrist, not elsewhere classified  Stiffness of right hand, not elsewhere classified  Muscle weakness (generalized)    Problem List Patient Active Problem List   Diagnosis Date Noted  . Breast cancer ([place]) 02/02/2017  . Environmental allergies 02/02/2017  . Menopausal syndrome 02/02/2017  . Chronic left hip pain 12/28/2016  . Left upper arm pain 12/28/2016  . Right foot pain 10/26/2016  . Greater trochanteric bursitis of left hip 08/03/2016  . Bilateral hand pain 04/27/2016  . Hyperlipemia   . Cognitive deficits   . Memory loss   . Lung nodule   . Malignant neoplasm of upper lobe of right lung ([place]) 05/27/2014    Rosalyn Gess OTR/L,CLT 05/18/2019, 9:14 AM  [facility] [address], [place], [address] Phone: [phone]   Fax:  [phone]  Name: Margaret Potts MRN: [id number] Date of Birth: 1955-11-29

## 2019-05-24 ENCOUNTER — Other Ambulatory Visit: Payer: Self-pay | Admitting: Internal Medicine

## 2019-05-24 ENCOUNTER — Telehealth: Payer: Self-pay | Admitting: Gastroenterology

## 2019-05-24 DIAGNOSIS — N644 Mastodynia: Secondary | ICD-10-CM

## 2019-05-25 ENCOUNTER — Ambulatory Visit: Payer: Federal, State, Local not specified - PPO | Admitting: Occupational Therapy

## 2019-05-25 ENCOUNTER — Other Ambulatory Visit: Payer: Self-pay

## 2019-05-25 DIAGNOSIS — M6281 Muscle weakness (generalized): Secondary | ICD-10-CM

## 2019-05-25 DIAGNOSIS — M25641 Stiffness of right hand, not elsewhere classified: Secondary | ICD-10-CM

## 2019-05-25 DIAGNOSIS — M25531 Pain in right wrist: Secondary | ICD-10-CM | POA: Diagnosis not present

## 2019-05-25 DIAGNOSIS — M654 Radial styloid tenosynovitis [de Quervain]: Secondary | ICD-10-CM

## 2019-05-25 DIAGNOSIS — M79641 Pain in right hand: Secondary | ICD-10-CM

## 2019-05-25 DIAGNOSIS — M25631 Stiffness of right wrist, not elsewhere classified: Secondary | ICD-10-CM

## 2019-05-25 NOTE — Patient Instructions (Signed)
Add some PROM for RD, UD, flexion ,ext over edge of table  10 reps 2 x day after heat   Composite UD stretch with thumb in fist - 10 x hold 5 sec  Keep pull for all of them less than 2/10

## 2019-05-25 NOTE — Therapy (Signed)
[facility] [address], [place], [address] Phone: [phone]   Fax:  [phone]  Occupational Therapy Treatment  Patient Details  Name: Margaret Potts MRN: [id number] Date of Birth: [birth year] Referring Provider (OT): Arvella Nigh   Encounter Date: 05/25/2019  OT End of Session - 05/25/19 0915    Visit Number  17    Number of Visits  18    Date for OT Re-Evaluation  06/01/19    OT Start Time  0828    OT Stop Time  0912    OT Time Calculation (min)  44 min    Activity Tolerance  Patient tolerated treatment well    Behavior During Therapy  [facility] for tasks assessed/performed       Past Medical History:  Diagnosis Date  . Arthritis   . Breast cancer ([place])    RT with mastectomy in 1995  . Cognitive deficits   . Depression   . Hyperlipemia   . Hypertension   . Lung cancer ([place])    RT upper lobe removed in OCT 2015  . Lung nodule   . Memory loss   . Seizures ([place])     Past Surgical History:  Procedure Laterality Date  . BREAST BIOPSY Left    benign  . BREAST BIOPSY Left 05/27/2017   [place] core ribbon REACTIVE FIBROSIS CONSISTENT WITH SCAR.   Marland Kitchen COLONOSCOPY WITH PROPOFOL N/A 02/09/2017   Procedure: COLONOSCOPY WITH PROPOFOL;  Surgeon: Jonathon Bellows, MD;  Location: [place];  Service: Gastroenterology;  Laterality: N/A;  . ESOPHAGOGASTRODUODENOSCOPY N/A 02/09/2017   Procedure: ESOPHAGOGASTRODUODENOSCOPY (EGD);  Surgeon: Jonathon Bellows, MD;  Location: [place];  Service: Gastroenterology;  Laterality: N/A;  . LUNG LOBECTOMY    . MASTECTOMY Right   . POLYPECTOMY N/A 02/09/2017   Procedure: POLYPECTOMY;  Surgeon: Jonathon Bellows, MD;  Location: [place];  Service: Gastroenterology;  Laterality: N/A;  . right mastectomy      There were no vitals filed for this visit.  Subjective Assessment - 05/25/19 0911    Subjective   I forgot my splint earlier this week - fell in my garage and had to  work without it - pain increase since then 3-4/10    Pertinent History  I had my symptoms slowly coming on from January - but gradually pain worsen the last 9 wks - work typing a lot and packages - and love gardening - my first  2 splints was not working - they were to [employer] - just got one yesterday that fits better    Patient Stated Goals  I want my hand and wrist to get better - so I can play gold, garden , working -   I want to retire with not pain down the road    Currently in Pain?  Yes    Pain Score  3     Pain Location  Wrist    Pain Orientation  Right    Pain Descriptors / Indicators  Aching;Tender    Pain Type  Acute pain    Pain Onset  More than a month ago         [facility] OT Assessment - 05/25/19 0001      Strength   Right Hand Grip (lbs)  29    Right Hand Lateral Pinch  9 lbs    Right Hand 3 Point Pinch  6 lbs    Left Hand Grip (lbs)  45  Left Hand Lateral Pinch  11 lbs    Left Hand 3 Point Pinch  12 lbs      assess grip and prehension - pt show increase strength with less pain  Pt did forgot her splint one day this week - and pain increase with use - had to write a lot  Pt Finkelstein positive  But not as tender over 1st dorsal compartment - more dorsal to it           skin check done - did not do graston this date on skin - no issues - pt to keep on for hour afterwards  OT Treatments/Exercises (OP) - 05/25/19 0001      Iontophoresis   Type of Iontophoresis  Dexamethasone    Location  1st dorsal compartment    Dose  med patch , 2.0 current     Time  19      RUE Contrast Bath   Time  9 minutes    Comments  wrist and hand prior to ROM      soft tissue mobs by OT done on radial, dorsal and volar wrist and forearm\  And MC spreads  Add  And review some PROM for RD, UD, flexion ,ext over edge of table  10 reps 2 x day after heat   Composite UD stretch with thumb in fist - 10 x hold 5 sec  Keep pull for all of them less than 2/10         OT  Education - 05/25/19 0915    Education Details  progress and POC    Person(s) Educated  Patient    Methods  Explanation;Demonstration;Tactile cues;Verbal cues;Handout    Comprehension  Returned demonstration;Verbalized understanding;Verbal cues required       OT Short Term Goals - 05/04/19 1315      OT SHORT TERM GOAL #1   Baseline  pain 10/10 at eval - could not touch it - now 3/10    Status  Achieved      OT SHORT TERM GOAL #2   Title  Pain on PRWHE improve with more than 20 points    Baseline  Eval pain on PRWHE 47/50 -10/10 -  pain improving but still pain with thumb AROM , wrist flexion , and Finkelstein , tenderness over distal radius head3-4/10    Time  3    Period  Weeks    Status  On-going    Target Date  05/25/19        OT Long Term Goals - 05/04/19 1317      OT LONG TERM GOAL #1   Title  Pain decrease for pt to wean out of splint more than 75% of time    Baseline  3-4/10 pain this date - cannot wean out of it yet    Time  3    Period  Weeks    Status  On-going    Target Date  05/25/19      OT LONG TERM GOAL #2   Title  R wrist and thumb AROM improve to WNL without increase symptoms    Baseline  AROM  and grip increased with less pain -but pain 3-4/10 still thumb PA, RA, opposition ,and wrist flexion    Time  3    Period  Weeks    Status  On-going    Target Date  05/25/19            Plan - 05/25/19 0916    Clinical  Impression Statement  Pt had splint off for day at work and pain since then 3/10 constant - but more dorsal to 1st dorsal compartment - did add some stretches this date to assess if more tightness than tenosynovitis - discuss again with pt going back to [place] for possible surgery    OT Occupational Profile and History  Problem Focused Assessment - Including review of records relating to presenting problem    Occupational performance deficits (Please refer to evaluation for details):  ADL's;IADL's;Leisure;Work;Play;Social Participation    Body  Structure / Function / Physical Skills  ADL;Flexibility;ROM;UE functional use;Dexterity;Edema;Pain;Strength;IADL    Rehab Potential  Fair    Clinical Decision Making  Limited treatment options, no task modification necessary    Comorbidities Affecting Occupational Performance:  None    Modification or Assistance to Complete Evaluation   No modification of tasks or assist necessary to complete eval    OT Frequency  1x / week    OT Duration  4 weeks    OT Treatment/Interventions  Self-care/ADL training;Iontophoresis;Therapeutic exercise;Patient/family education;Splinting;Fluidtherapy;Contrast Bath;Manual Therapy    Plan  progress after acupuncture and ionto    OT Home Exercise Plan  see pt instruction    Consulted and Agree with Plan of Care  Patient       Patient will benefit from skilled therapeutic intervention in order to improve the following deficits and impairments:   Body Structure / Function / Physical Skills: ADL, Flexibility, ROM, UE functional use, Dexterity, Edema, Pain, Strength, IADL       Visit Diagnosis: Pain in right wrist  Radial styloid tenosynovitis  Stiffness of right wrist, not elsewhere classified  Stiffness of right hand, not elsewhere classified  Muscle weakness (generalized)  Pain in right hand    Problem List Patient Active Problem List   Diagnosis Date Noted  . Breast cancer ([place]) 02/02/2017  . Environmental allergies 02/02/2017  . Menopausal syndrome 02/02/2017  . Chronic left hip pain 12/28/2016  . Left upper arm pain 12/28/2016  . Right foot pain 10/26/2016  . Greater trochanteric bursitis of left hip 08/03/2016  . Bilateral hand pain 04/27/2016  . Hyperlipemia   . Cognitive deficits   . Memory loss   . Lung nodule   . Malignant neoplasm of upper lobe of right lung ([place]) 05/27/2014    Rosalyn Gess OTR/L,CLT 05/25/2019, 9:19 AM  [facility] [address] [place], [address] Phone: [phone]   Fax:  [phone]  Name: Margaret Potts MRN: [id number] Date of Birth: June 28, 1956

## 2019-05-28 NOTE — Telephone Encounter (Signed)
error 

## 2019-06-01 ENCOUNTER — Ambulatory Visit: Payer: Federal, State, Local not specified - PPO | Admitting: Occupational Therapy

## 2019-06-01 ENCOUNTER — Other Ambulatory Visit: Payer: Self-pay

## 2019-06-01 DIAGNOSIS — M25641 Stiffness of right hand, not elsewhere classified: Secondary | ICD-10-CM

## 2019-06-01 DIAGNOSIS — M79641 Pain in right hand: Secondary | ICD-10-CM

## 2019-06-01 DIAGNOSIS — M25631 Stiffness of right wrist, not elsewhere classified: Secondary | ICD-10-CM

## 2019-06-01 DIAGNOSIS — M654 Radial styloid tenosynovitis [de Quervain]: Secondary | ICD-10-CM

## 2019-06-01 DIAGNOSIS — M6281 Muscle weakness (generalized): Secondary | ICD-10-CM

## 2019-06-01 DIAGNOSIS — M25531 Pain in right wrist: Secondary | ICD-10-CM

## 2019-06-01 NOTE — Therapy (Signed)
[facility] [address] [place], [address] Phone: [phone]   Fax:  [phone]  Occupational Therapy Treatment  Patient Details  Name: Margaret Potts MRN: [id number] Date of Birth: 1956/02/28 Referring Provider (OT): Arvella Nigh   Encounter Date: 06/01/2019  OT End of Session - 06/01/19 0915    Visit Number  18    Number of Visits  21    Date for OT Re-Evaluation  07/13/19    OT Start Time  0828    OT Stop Time  0901    OT Time Calculation (min)  33 min    Activity Tolerance  Patient tolerated treatment well    Behavior During Therapy  [facility] for tasks assessed/performed       Past Medical History:  Diagnosis Date  . Arthritis   . Breast cancer ([place])    RT with mastectomy in 1995  . Cognitive deficits   . Depression   . Hyperlipemia   . Hypertension   . Lung cancer ([place])    RT upper lobe removed in OCT 2015  . Lung nodule   . Memory loss   . Seizures ([place])     Past Surgical History:  Procedure Laterality Date  . BREAST BIOPSY Left    benign  . BREAST BIOPSY Left 05/27/2017   [place] core ribbon REACTIVE FIBROSIS CONSISTENT WITH SCAR.   Marland Kitchen COLONOSCOPY WITH PROPOFOL N/A 02/09/2017   Procedure: COLONOSCOPY WITH PROPOFOL;  Surgeon: Jonathon Bellows, MD;  Location: [facility];  Service: Gastroenterology;  Laterality: N/A;  . ESOPHAGOGASTRODUODENOSCOPY N/A 02/09/2017   Procedure: ESOPHAGOGASTRODUODENOSCOPY (EGD);  Surgeon: Jonathon Bellows, MD;  Location: [facility];  Service: Gastroenterology;  Laterality: N/A;  . LUNG LOBECTOMY    . MASTECTOMY Right   . POLYPECTOMY N/A 02/09/2017   Procedure: POLYPECTOMY;  Surgeon: Jonathon Bellows, MD;  Location: [facility];  Service: Gastroenterology;  Laterality: N/A;  . right mastectomy      There were no vitals filed for this visit.  Subjective Assessment - 06/01/19 0838    Subjective   It is so much better than it was in the past - and about 2-3/10 -  still tight bending my wrist sideways and slight pull- but can we take off the hard piece - so I can wear the soft one at times    Pertinent History  I had my symptoms slowly coming on from January - but gradually pain worsen the last 9 wks - work typing a lot and packages - and love gardening - my first  2 splints was not working - they were to [employer] - just got one yesterday that fits better    Patient Stated Goals  I want my hand and wrist to get better - so I can play gold, garden , working -   I want to retire with not pain down the road    Currently in Pain?  Yes    Pain Score  2     Pain Location  Wrist    Pain Orientation  Right    Pain Descriptors / Indicators  Tender    Pain Type  Acute pain    Pain Onset  More than a month ago    Pain Frequency  Intermittent         [facility] OT Assessment - 06/01/19 0001      AROM   Right Wrist Extension  70 Degrees    Right Wrist Flexion  80 Degrees    Right Wrist Radial Deviation  25 Degrees    Right Wrist Ulnar Deviation  20 Degrees    Left Wrist Extension  74 Degrees    Left Wrist Flexion  90 Degrees    Left Wrist Radial Deviation  30 Degrees    Left Wrist Ulnar Deviation  30 Degrees      Strength   Right Hand Grip (lbs)  29    Right Hand Lateral Pinch  9 lbs    Right Hand 3 Point Pinch  6 lbs    Left Hand Grip (lbs)  45    Left Hand Lateral Pinch  11 lbs    Left Hand 3 Point Pinch  12 lbs       assess AROM and strength - see flow sheet  Composite UD still decrease - and slight pull at 1st dorsal compartment and tender - 2/10  pt prefer doing acupuncture - and pt had session Tues  Wrist strength 5/5 in all ranges - but pain with RD and thumb RA         OT Treatments/Exercises (OP) - 06/01/19 0001      Moist Heat Therapy   Number Minutes Moist Heat  8 Minutes    Moist Heat Location  Wrist   prior to soft tissue    Pt to cont with composite stretch for UD with thumb ADD  graston toolr nr 2 done on wrist and forearm -  sweeping - volar , radial and dorsal - prior to stretches    Soft neoprene wrist and thumb wrap with light act wean too And slide hard custom thumb spica on when doing anything repetitive and pinching/gripping         OT Education - 06/01/19 0914    Education Details  splint weaning and stretches    Person(s) Educated  Patient    Methods  Explanation;Demonstration;Tactile cues;Verbal cues;Handout    Comprehension  Returned demonstration;Verbalized understanding;Verbal cues required       OT Short Term Goals - 05/04/19 1315      OT SHORT TERM GOAL #1   Baseline  pain 10/10 at eval - could not touch it - now 3/10    Status  Achieved      OT SHORT TERM GOAL #2   Title  Pain on PRWHE improve with more than 20 points    Baseline  Eval pain on PRWHE 47/50 -10/10 -  pain improving but still pain with thumb AROM , wrist flexion , and Finkelstein , tenderness over distal radius head3-4/10    Time  3    Period  Weeks    Status  On-going    Target Date  05/25/19        OT Long Term Goals - 05/04/19 1317      OT LONG TERM GOAL #1   Title  Pain decrease for pt to wean out of splint more than 75% of time    Baseline  3-4/10 pain this date - cannot wean out of it yet    Time  3    Period  Weeks    Status  On-going    Target Date  05/25/19      OT LONG TERM GOAL #2   Title  R wrist and thumb AROM improve to WNL without increase symptoms    Baseline  AROM  and grip increased with less pain -but pain 3-4/10 still thumb PA, RA, opposition ,and wrist flexion  Time  3    Period  Weeks    Status  On-going    Target Date  05/25/19            Plan - 06/01/19 1448    Clinical Impression Statement  Pt pain decrease to about 2/10 - with stiffness with composite UD - pt wean this date to soft neoprene thumb /wrist wrap to wear with light act -and to slide on ther thumb custome spica if doing anything repetitive / pinching or gripping - cont with stretches and will follow up  biweekly - pt decline dryneedling - doing acupuncture, and do not want to consider surgical intervention    OT Occupational Profile and History  Problem Focused Assessment - Including review of records relating to presenting problem    Occupational performance deficits (Please refer to evaluation for details):  ADL's;IADL's;Leisure;Work;Play;Social Participation    Body Structure / Function / Physical Skills  ADL;Flexibility;ROM;UE functional use;Dexterity;Edema;Pain;Strength;IADL    Rehab Potential  Fair    Clinical Decision Making  Limited treatment options, no task modification necessary    Comorbidities Affecting Occupational Performance:  None    Modification or Assistance to Complete Evaluation   No modification of tasks or assist necessary to complete eval    OT Frequency  Biweekly    OT Duration  6 weeks    OT Treatment/Interventions  Self-care/ADL training;Iontophoresis;Therapeutic exercise;Patient/family education;Splinting;Fluidtherapy;Contrast Bath;Manual Therapy    Plan  assess how doing with weaning out of splint    OT Home Exercise Plan  see pt instruction    Consulted and Agree with Plan of Care  Patient       Patient will benefit from skilled therapeutic intervention in order to improve the following deficits and impairments:   Body Structure / Function / Physical Skills: ADL, Flexibility, ROM, UE functional use, Dexterity, Edema, Pain, Strength, IADL       Visit Diagnosis: Pain in right wrist  Radial styloid tenosynovitis  Stiffness of right wrist, not elsewhere classified  Stiffness of right hand, not elsewhere classified  Muscle weakness (generalized)  Pain in right hand    Problem List Patient Active Problem List   Diagnosis Date Noted  . Breast cancer ([place]) 02/02/2017  . Environmental allergies 02/02/2017  . Menopausal syndrome 02/02/2017  . Chronic left hip pain 12/28/2016  . Left upper arm pain 12/28/2016  . Right foot pain 10/26/2016  . Greater  trochanteric bursitis of left hip 08/03/2016  . Bilateral hand pain 04/27/2016  . Hyperlipemia   . Cognitive deficits   . Memory loss   . Lung nodule   . Malignant neoplasm of upper lobe of right lung ([place]) 05/27/2014    Jemina Scahill OTR/l,CLT 06/01/2019, 9:22 AM  [facility] [address], [place], [address] Phone: [phone]   Fax:  [phone]  Name: Landree Fernholz MRN: [id number] Date of Birth: 1955-10-02

## 2019-06-01 NOTE — Patient Instructions (Signed)
Cont with UD stretch and composite with thumb ADD Soft neoprene wrist and thumb wrap with light act  And slide hard custom thumb spica on when doing anything that cause pain - gripping , pinching or repetition

## 2019-06-05 ENCOUNTER — Ambulatory Visit
Admission: RE | Admit: 2019-06-05 | Discharge: 2019-06-05 | Disposition: A | Discharge status: Home / Self Care | Payer: Federal, State, Local not specified - PPO | Source: Ambulatory Visit | Attending: Internal Medicine | Admitting: Internal Medicine

## 2019-06-05 DIAGNOSIS — N644 Mastodynia: Secondary | ICD-10-CM | POA: Insufficient documentation

## 2019-06-15 ENCOUNTER — Other Ambulatory Visit: Payer: Self-pay

## 2019-06-15 ENCOUNTER — Ambulatory Visit: Payer: Federal, State, Local not specified - PPO | Attending: Orthopedic Surgery | Admitting: Occupational Therapy

## 2019-06-15 DIAGNOSIS — M25631 Stiffness of right wrist, not elsewhere classified: Secondary | ICD-10-CM | POA: Insufficient documentation

## 2019-06-15 DIAGNOSIS — M25531 Pain in right wrist: Secondary | ICD-10-CM

## 2019-06-15 DIAGNOSIS — M6281 Muscle weakness (generalized): Secondary | ICD-10-CM

## 2019-06-15 DIAGNOSIS — M654 Radial styloid tenosynovitis [de Quervain]: Secondary | ICD-10-CM | POA: Diagnosis present

## 2019-06-15 DIAGNOSIS — M25641 Stiffness of right hand, not elsewhere classified: Secondary | ICD-10-CM

## 2019-06-15 DIAGNOSIS — M79641 Pain in right hand: Secondary | ICD-10-CM

## 2019-06-15 NOTE — Therapy (Signed)
[facility] [facility] [address], [place], [address] Phone: [phone]   Fax:  [phone]  Occupational Therapy Treatment  Patient Details  Name: Margaret Potts MRN: [id number] Date of Birth: June 22, 1956 Referring Provider (OT): Arvella Nigh   Encounter Date: 06/15/2019  OT End of Session - 06/15/19 1137    Visit Number  19    Number of Visits  21    Date for OT Re-Evaluation  07/13/19    OT Start Time  0831    OT Stop Time  0909    OT Time Calculation (min)  38 min    Activity Tolerance  Patient tolerated treatment well    Behavior During Therapy  [facility] for tasks assessed/performed       Past Medical History:  Diagnosis Date  . Arthritis   . Breast cancer ([place])    RT with mastectomy in 1995  . Cognitive deficits   . Depression   . Hyperlipemia   . Hypertension   . Lung cancer ([place])    RT upper lobe removed in OCT 2015  . Lung nodule   . Memory loss   . Seizures ([place])     Past Surgical History:  Procedure Laterality Date  . BREAST BIOPSY Left    benign  . BREAST BIOPSY Left 05/27/2017   [place] core ribbon REACTIVE FIBROSIS CONSISTENT WITH SCAR.   Marland Kitchen COLONOSCOPY WITH PROPOFOL N/A 02/09/2017   Procedure: COLONOSCOPY WITH PROPOFOL;  Surgeon: Jonathon Bellows, MD;  Location: [place];  Service: Gastroenterology;  Laterality: N/A;  . ESOPHAGOGASTRODUODENOSCOPY N/A 02/09/2017   Procedure: ESOPHAGOGASTRODUODENOSCOPY (EGD);  Surgeon: Jonathon Bellows, MD;  Location: [place];  Service: Gastroenterology;  Laterality: N/A;  . LUNG LOBECTOMY    . MASTECTOMY Right   . POLYPECTOMY N/A 02/09/2017   Procedure: POLYPECTOMY;  Surgeon: Jonathon Bellows, MD;  Location: [place];  Service: Gastroenterology;  Laterality: N/A;  . right mastectomy      There were no vitals filed for this visit.  Subjective Assessment - 06/15/19 1135    Subjective   Doing better- I am doing hard splint at work -and then at home  mostly soft and since 2 days ago - starting taking soft one off - still pain like lifting christmas tree and big cup or glass -    Pertinent History  I had my symptoms slowly coming on from January - but gradually pain worsen the last 9 wks - work typing a lot and packages - and love gardening - my first  2 splints was not working - they were to [employer] - just got one yesterday that fits better    Patient Stated Goals  I want my hand and wrist to get better - so I can play gold, garden , working -   I want to retire with not pain down the road    Currently in Pain?  Yes    Pain Score  2     Pain Location  Wrist    Pain Orientation  Right    Pain Descriptors / Indicators  Tender;Aching    Pain Type  Acute pain         [facility] OT Assessment - 06/15/19 0001      AROM   Right Wrist Extension  72 Degrees    Right Wrist Flexion  85 Degrees    Right Wrist Radial Deviation  24 Degrees    Right Wrist Ulnar Deviation  30 Degrees  Left Wrist Extension  74 Degrees    Left Wrist Flexion  90 Degrees    Left Wrist Radial Deviation  30 Degrees    Left Wrist Ulnar Deviation  30 Degrees      Strength   Right Hand Grip (lbs)  36    Right Hand Lateral Pinch  12 lbs    Right Hand 3 Point Pinch  9 lbs    Left Hand Grip (lbs)  45    Left Hand Lateral Pinch  11 lbs    Left Hand 3 Point Pinch  12 lbs      Right Hand AROM   R Thumb Radial ABduction/ADduction 0-55  55    R Thumb Palmar ABduction/ADduction 0-45  60      great progress in grip and prehension strength-  Without pain  But still tender over distal radius head and Finkelstein - 2/10  Pain with end range thumb RA and wide grip  Done with pt some golf putty - had no pain with hard splint  Soft splint also no pain  But 50% swing had pain in hard and soft splint   pt can do some putting at home - if splint on   Strength for wrist 5/5 except sup - pain 4/5   Ed on wearing hard splint at work and act that has pain linger for 2 hrs or if pain  12-24 hrs later  Pt to follow up in 2 wks         [place] at end of session  OT Treatments/Exercises (OP) - 06/15/19 0001      Ultrasound   Ultrasound Location  1st dorsal compartment and dorsal to it    Ultrasound Parameters  3.3MHZ 20%, 1.0       RUE Contrast Bath   Time  9 minutes    Comments  prior to soft tissue after ROM      After contrast done some soft tissue for MC spreads and webspace  graston tool nr 2 done on wrist and forearm - sweeping - volar , radial and dorsal - prior to stretches         OT Education - 06/15/19 1137    Education Details  splint weaning and stretches    Person(s) Educated  Patient    Methods  Explanation;Demonstration;Tactile cues;Verbal cues;Handout    Comprehension  Returned demonstration;Verbalized understanding;Verbal cues required       OT Short Term Goals - 05/04/19 1315      OT SHORT TERM GOAL #1   Baseline  pain 10/10 at eval - could not touch it - now 3/10    Status  Achieved      OT SHORT TERM GOAL #2   Title  Pain on PRWHE improve with more than 20 points    Baseline  Eval pain on PRWHE 47/50 -10/10 -  pain improving but still pain with thumb AROM , wrist flexion , and Finkelstein , tenderness over distal radius head3-4/10    Time  3    Period  Weeks    Status  On-going    Target Date  05/25/19        OT Long Term Goals - 05/04/19 1317      OT LONG TERM GOAL #1   Title  Pain decrease for pt to wean out of splint more than 75% of time    Baseline  3-4/10 pain this date - cannot wean out of it yet    Time  3    Period  Weeks    Status  On-going    Target Date  05/25/19      OT LONG TERM GOAL #2   Title  R wrist and thumb AROM improve to WNL without increase symptoms    Baseline  AROM  and grip increased with less pain -but pain 3-4/10 still thumb PA, RA, opposition ,and wrist flexion    Time  3    Period  Weeks    Status  On-going    Target Date  05/25/19            Plan - 06/15/19 1138    Clinical  Impression Statement  Pt pain cont to be 1-2/10 with tenderness over distal radius and Finkelstein - stil some pain with end range RA of thumb , wide grip. Grip and prehension strength increase greatly and did not had pain -report at home wearing soft splint about 50% - and hard splint at work - pt ed on weaning slowly - and when to wear splint - will follow up in 2 wks    OT Occupational Profile and History  Problem Focused Assessment - Including review of records relating to presenting problem    Occupational performance deficits (Please refer to evaluation for details):  ADL's;IADL's;Leisure;Work;Play;Social Participation    Body Structure / Function / Physical Skills  ADL;Flexibility;ROM;UE functional use;Dexterity;Edema;Pain;Strength;IADL    Rehab Potential  Fair    Clinical Decision Making  Limited treatment options, no task modification necessary    Comorbidities Affecting Occupational Performance:  None    Modification or Assistance to Complete Evaluation   No modification of tasks or assist necessary to complete eval    OT Frequency  Biweekly    OT Duration  4 weeks    OT Treatment/Interventions  Self-care/ADL training;Iontophoresis;Therapeutic exercise;Patient/family education;Splinting;Fluidtherapy;Contrast Bath;Manual Therapy    Plan  assess how doing with weaning out of splint    OT Home Exercise Plan  see pt instruction    Consulted and Agree with Plan of Care  Patient       Patient will benefit from skilled therapeutic intervention in order to improve the following deficits and impairments:   Body Structure / Function / Physical Skills: ADL, Flexibility, ROM, UE functional use, Dexterity, Edema, Pain, Strength, IADL       Visit Diagnosis: Pain in right wrist  Radial styloid tenosynovitis  Stiffness of right wrist, not elsewhere classified  Stiffness of right hand, not elsewhere classified  Muscle weakness (generalized)  Pain in right hand    Problem List Patient  Active Problem List   Diagnosis Date Noted  . Breast cancer ([place]) 02/02/2017  . Environmental allergies 02/02/2017  . Menopausal syndrome 02/02/2017  . Chronic left hip pain 12/28/2016  . Left upper arm pain 12/28/2016  . Right foot pain 10/26/2016  . Greater trochanteric bursitis of left hip 08/03/2016  . Bilateral hand pain 04/27/2016  . Hyperlipemia   . Cognitive deficits   . Memory loss   . Lung nodule   . Malignant neoplasm of upper lobe of right lung ([place]) 05/27/2014    Rosalyn Gess OTR/L,CLT 06/15/2019, 11:41 AM  [place] [address] [place], [address] Phone: [phone]   Fax:  [phone]  Name: Margaret Potts MRN: [id number] Date of Birth: 11-06-1955

## 2019-06-15 NOTE — Patient Instructions (Signed)
Hard splint with work and any act that pain lingers 2 hrs after wards  Or if pain 12-24 hrs later  Can start putting golf little - but keep hard splint on

## 2019-06-29 ENCOUNTER — Ambulatory Visit: Payer: Federal, State, Local not specified - PPO | Admitting: Occupational Therapy

## 2019-06-29 ENCOUNTER — Other Ambulatory Visit: Payer: Self-pay

## 2019-06-29 DIAGNOSIS — M25531 Pain in right wrist: Secondary | ICD-10-CM

## 2019-06-29 DIAGNOSIS — M654 Radial styloid tenosynovitis [de Quervain]: Secondary | ICD-10-CM

## 2019-06-29 NOTE — Patient Instructions (Signed)
Pt to follow up with MD -cont splint at work

## 2019-06-29 NOTE — Therapy (Signed)
[place] [facility] [address], [place], [address] Phone: [phone]   Fax:  [phone]  Occupational Therapy Treatment  Patient Details  Name: Margaret Potts MRN: [id number] Date of Birth: 21-Apr-1956 Referring Provider (OT): Arvella Nigh   Encounter Date: 06/29/2019  OT End of Session - 06/29/19 1049    Visit Number  20    Number of Visits  20    Date for OT Re-Evaluation  06/29/19    OT Start Time  0830    OT Stop Time  0908    OT Time Calculation (min)  38 min       Past Medical History:  Diagnosis Date  . Arthritis   . Breast cancer ([place])    RT with mastectomy in 1995  . Cognitive deficits   . Depression   . Hyperlipemia   . Hypertension   . Lung cancer ([place])    RT upper lobe removed in OCT 2015  . Lung nodule   . Memory loss   . Seizures ([place])     Past Surgical History:  Procedure Laterality Date  . BREAST BIOPSY Left    benign  . BREAST BIOPSY Left 05/27/2017   [place] core ribbon REACTIVE FIBROSIS CONSISTENT WITH SCAR.   Marland Kitchen COLONOSCOPY WITH PROPOFOL N/A 02/09/2017   Procedure: COLONOSCOPY WITH PROPOFOL;  Surgeon: Jonathon Bellows, MD;  Location: [facility];  Service: Gastroenterology;  Laterality: N/A;  . ESOPHAGOGASTRODUODENOSCOPY N/A 02/09/2017   Procedure: ESOPHAGOGASTRODUODENOSCOPY (EGD);  Surgeon: Jonathon Bellows, MD;  Location: [employer] [facility];  Service: Gastroenterology;  Laterality: N/A;  . LUNG LOBECTOMY    . MASTECTOMY Right   . POLYPECTOMY N/A 02/09/2017   Procedure: POLYPECTOMY;  Surgeon: Jonathon Bellows, MD;  Location: [facility];  Service: Gastroenterology;  Laterality: N/A;  . right mastectomy      There were no vitals filed for this visit.  Subjective Assessment - 06/29/19 1045    Subjective   Doing okay - pain still there if I turn doorknob , pick up something with wide grip where my thumb has to open. still wearing my hard splint at work - and then at home soft splint     Pertinent History  I had my symptoms slowly coming on from January - but gradually pain worsen the last 9 wks - work typing a lot and packages - and love gardening - my first  2 splints was not working - they were to [employer] - just got one yesterday that fits better    Patient Stated Goals  I want my hand and wrist to get better - so I can play gold, garden , working -   I want to retire with not pain down the road    Currently in Pain?  Yes    Pain Score  4     Pain Location  Wrist    Pain Orientation  Right    Pain Descriptors / Indicators  Tender;Aching    Pain Radiating Towards  FInkelstein- opening thumb to do wide grip         [facility] OT Assessment - 06/29/19 0001      AROM   Right Wrist Extension  70 Degrees    Right Wrist Flexion  90 Degrees    Right Wrist Radial Deviation  26 Degrees    Right Wrist Ulnar Deviation  26 Degrees   pain with resistance   Left Wrist Extension  74 Degrees    Left  Wrist Flexion  90 Degrees    Left Wrist Radial Deviation  30 Degrees    Left Wrist Ulnar Deviation  30 Degrees      Strength   Right Hand Grip (lbs)  36    Right Hand Lateral Pinch  11 lbs    Right Hand 3 Point Pinch  9 lbs    Left Hand Grip (lbs)  45    Left Hand Lateral Pinch  11 lbs    Left Hand 3 Point Pinch  10 lbs      Right Hand AROM   R Thumb Radial ABduction/ADduction 0-55  50   pain with resistance   R Thumb Palmar ABduction/ADduction 0-45  60   pain with resistance   R Thumb Opposition to Index  --   to [facility] - no pain      Pt made great progress from [facility] - but cont to have pain between 1-4/10 with FInkelstein and tenderness - cannot wean out of her splint  Thumb spica still on at work - [employer]  Pain with resistance to thumb PA , RA and UD of wrist Turning doorknob , opening hand to grasp wide grip on cup still hurts   Pt was not open to see surgeon before and was seeing her acupuncturist too. But this date pt open - and will call surgeon for evaluation              OT Treatments/Exercises (OP) - 06/29/19 0001      Ultrasound   Ultrasound Location  1st dorsal comparment     Ultrasound Parameters  3.3MHz 20%, 1.0     Ultrasound Goals  Pain      RUE Contrast Bath   Time  9 minutes    Comments  end of session to decrease pain after ROM              OT Education - 06/29/19 1049    Education Details  splint wearing -to see MD    Person(s) Educated  Patient    Methods  Explanation;Demonstration;Tactile cues;Verbal cues;Handout    Comprehension  Returned demonstration;Verbalized understanding;Verbal cues required       OT Short Term Goals - 06/29/19 1125      OT SHORT TERM GOAL #1   Title  Pt to be independent in HEP to wear splint , do HEP and modify using her hand to decrease pain    Status  Achieved      OT SHORT TERM GOAL #2   Title  Pain on PRWHE improve with more than 20 points    Baseline  Eval pain on PRWHE 47/50 -10/10 -  pain improving but still pain with thumb AROM , wrist flexion , and Finkelstein , tenderness over distal radius head3-4/10    Status  Partially Met        OT Long Term Goals - 06/29/19 1125      OT LONG TERM GOAL #1   Title  Pain decrease for pt to wean out of splint more than 75% of time    Baseline  3-4/10 pain - wean out of hard one - during day at home - thumb spica on at work    Status  Partially [place] #2   Title  R wrist and thumb AROM improve to WNL without increase symptoms    Baseline  AROM  and grip increased with less pain -but pain 3-4/10 still thumb  PA, RA, wrist UD    Status  Partially Met            Plan - 06/29/19 1050    Clinical Impression Statement  Pt show great progress from [facility] in pain over 1st dorsal compartment , tenderness, AROM and strength in R hand and wrist - but pt cont to have pain between 2-4/10 with tenderness over 1st dorsal compartment , Finkelstein , and thumb PA/RA , wrist UD - pt work at [employer] and use  to use her thumb a lot- now wearing splint at work still - pt see acupuncturist too -  pt before did not want to go to surgeon - but open this time - pt to make appt with surgeon for evaluation of wrist    OT Occupational Profile and History  Problem Focused Assessment - Including review of records relating to presenting problem    Occupational performance deficits (Please refer to evaluation for details):  ADL's;IADL's;Leisure;Work;Play;Social Participation    Body Structure / Function / Physical Skills  ADL;Flexibility;ROM;UE functional use;Dexterity;Edema;Pain;Strength;IADL    Rehab Potential  Fair    Clinical Decision Making  Limited treatment options, no task modification necessary    Modification or Assistance to Complete Evaluation   No modification of tasks or assist necessary to complete eval    OT Treatment/Interventions  Self-care/ADL training;Iontophoresis;Therapeutic exercise;Patient/family education;Splinting;Fluidtherapy;Contrast Bath;Manual Therapy    Plan  Pt refer to surgeon    OT Home Exercise Plan  see pt instruction    Consulted and Agree with Plan of Care  Patient       Patient will benefit from skilled therapeutic intervention in order to improve the following deficits and impairments:   Body Structure / Function / Physical Skills: ADL, Flexibility, ROM, UE functional use, Dexterity, Edema, Pain, Strength, IADL       Visit Diagnosis: Pain in right wrist  Radial styloid tenosynovitis    Problem List Patient Active Problem List   Diagnosis Date Noted  . Breast cancer ([place]) 02/02/2017  . Environmental allergies 02/02/2017  . Menopausal syndrome 02/02/2017  . Chronic left hip pain 12/28/2016  . Left upper arm pain 12/28/2016  . Right foot pain 10/26/2016  . Greater trochanteric bursitis of left hip 08/03/2016  . Bilateral hand pain 04/27/2016  . Hyperlipemia   . Cognitive deficits   . Memory loss   . Lung nodule   . Malignant neoplasm of upper lobe of  right lung ([place]) 05/27/2014    Rosalyn Gess OTR/L,CLT 06/29/2019, 11:27 AM  [facility] [address], [place], [address] Phone: [phone]   Fax:  [phone]  Name: Ayodele Hartsock MRN: [id number] Date of Birth: August 23, 1955

## 2019-07-25 ENCOUNTER — Ambulatory Visit: Payer: Federal, State, Local not specified - PPO | Admitting: Gastroenterology

## 2019-08-08 ENCOUNTER — Other Ambulatory Visit: Payer: Self-pay

## 2019-08-08 ENCOUNTER — Encounter: Payer: Self-pay | Admitting: Gastroenterology

## 2019-08-08 ENCOUNTER — Ambulatory Visit (INDEPENDENT_AMBULATORY_CARE_PROVIDER_SITE_OTHER): Payer: Federal, State, Local not specified - PPO | Admitting: Gastroenterology

## 2019-08-08 VITALS — BP 149/93 | HR 55 | Temp 98.2°F | Resp 16 | Ht 60.0 in | Wt 121.6 lb

## 2019-08-08 DIAGNOSIS — Z86010 Personal history of colonic polyps: Secondary | ICD-10-CM

## 2019-08-08 NOTE — Progress Notes (Signed)
Jonathon Bellows MD, MRCP(U.K) [address]  [place]  [place], [place] [address]  Main: [phone]  Fax: [phone]   Gastroenterology Consultation  Referring Provider:     Idelle Crouch, MD Primary Care Physician:  Idelle Crouch, MD Primary Gastroenterologist:  Dr. Jonathon Bellows  Reason for Consultation:    Colon polyps        HPI:   Margaret Potts is a 64 y.o. y/o female was seen by myself over 2 years back in July 2018 for rectal bleeding and melena Prior imaging CT scan 01/16/17 - questionable mild thickening of the distal sigmoid colon - mass not excluded and sigmoidoscopy. No mention of diverticulitis on scan or diverticulosis.  recommended. She has apparently had a colonoscopy in 2015 by Dr Rayann Heman and a polyp was excised. H/o lung cancer in 2015 treated by surgery .  At that point of time she was recommended to undergo an EGD and colonoscopy   02/09/2017: EGD normal colonoscopy a 15 mm pedunculated polyp was resected and the pathology report demonstrated mucosal prolapse and no abnormal tissue.  Since it was a large polyp I recommended her to have a repeat colonoscopy in 3 years.  Today she is here just to discuss the above recommendation.  Past Medical History:  Diagnosis Date  . Arthritis   . Breast cancer ([place])    RT with mastectomy in 1995  . Cognitive deficits   . Depression   . Hyperlipemia   . Hypertension   . Lung cancer ([place])    RT upper lobe removed in OCT 2015  . Lung nodule   . Memory loss   . Seizures ([place])     Past Surgical History:  Procedure Laterality Date  . BREAST BIOPSY Left    benign  . BREAST BIOPSY Left 05/27/2017   [place] core ribbon REACTIVE FIBROSIS CONSISTENT WITH SCAR.   Marland Kitchen COLONOSCOPY WITH PROPOFOL N/A 02/09/2017   Procedure: COLONOSCOPY WITH PROPOFOL;  Surgeon: Jonathon Bellows, MD;  Location: [facility];  Service: Gastroenterology;  Laterality: N/A;  . ESOPHAGOGASTRODUODENOSCOPY N/A 02/09/2017   Procedure:  ESOPHAGOGASTRODUODENOSCOPY (EGD);  Surgeon: Jonathon Bellows, MD;  Location: [facility];  Service: Gastroenterology;  Laterality: N/A;  . LUNG LOBECTOMY    . MASTECTOMY Right   . POLYPECTOMY N/A 02/09/2017   Procedure: POLYPECTOMY;  Surgeon: Jonathon Bellows, MD;  Location: [place];  Service: Gastroenterology;  Laterality: N/A;  . right mastectomy      Prior to Admission medications   Medication Sig Start Date End Date Taking? Authorizing Provider  Ascorbic Acid (VITAMIN C) 1000 MG tablet Take 1,000 mg by mouth daily.    [provider]  benzonatate (TESSALON) 100 MG capsule  12/21/16   [provider]  bisoprolol (ZEBETA) 5 MG tablet Take 5 mg by mouth daily.    [provider]  Misc Natural Products (BEE PROPOLIS PO) Take by mouth.    [provider]  olopatadine (PATANOL) 0.1 % ophthalmic solution Apply to eye. 09/16/16   [provider]  omeprazole (PRILOSEC) 20 MG capsule  01/27/17   [provider]  vitamin E 400 UNIT capsule Take by mouth.    [provider]    Family History  Problem Relation Age of Onset  . Breast cancer Paternal Aunt   . Liver cancer Mother   . Lung cancer Sister   . Liver cancer Brother   . Hematuria Neg Hx   . Renal cancer Neg Hx  Social History   Tobacco Use  . Smoking status: Never Smoker  . Smokeless tobacco: Never Used  Substance Use Topics  . Alcohol use: Yes    Comment: occassionally  . Drug use: No    Allergies as of 08/08/2019  . (No Known Allergies)    Review of Systems:    All systems reviewed and negative except where noted in HPI.   Physical Exam:  There were no vitals taken for this visit. No LMP recorded. Patient is postmenopausal. Psych:  Alert and cooperative. Normal mood and affect. General:   Alert,  Well-developed, well-nourished, pleasant and cooperative in NAD Head:  Normocephalic and atraumatic. Psych:  Alert and cooperative. Normal mood and  affect.  Imaging Studies: No results found.  Assessment and Plan:   Margaret Potts is a 64 y.o. y/o female here to see me today to discuss about her next colonoscopy.  Last colonoscopy in 02/28/2017 was performed and a 15 mm benign polyp was resected.  Explained to her that I am not too concerned over the polyp and recommended her to have a repeat colonoscopy in 3 years which would be at the end of this year since the polyp was 15 mm in size.  She had no further questions  Follow up as needed  Dr Jonathon Bellows MD,MRCP(U.K)

## 2020-05-02 ENCOUNTER — Other Ambulatory Visit: Payer: Self-pay | Admitting: Internal Medicine

## 2020-05-02 DIAGNOSIS — Z1231 Encounter for screening mammogram for malignant neoplasm of breast: Secondary | ICD-10-CM

## 2020-06-10 ENCOUNTER — Other Ambulatory Visit: Payer: Self-pay

## 2020-06-10 ENCOUNTER — Ambulatory Visit
Admission: RE | Admit: 2020-06-10 | Discharge: 2020-06-10 | Disposition: A | Discharge status: Home / Self Care | Payer: Federal, State, Local not specified - PPO | Source: Ambulatory Visit | Attending: Internal Medicine | Admitting: Internal Medicine

## 2020-06-10 DIAGNOSIS — Z1231 Encounter for screening mammogram for malignant neoplasm of breast: Secondary | ICD-10-CM | POA: Diagnosis not present

## 2020-07-30 ENCOUNTER — Ambulatory Visit: Payer: Federal, State, Local not specified - PPO | Attending: Sports Medicine | Admitting: Physical Therapy

## 2020-07-30 ENCOUNTER — Other Ambulatory Visit: Payer: Self-pay

## 2020-07-30 ENCOUNTER — Encounter: Payer: Self-pay | Admitting: Physical Therapy

## 2020-07-30 DIAGNOSIS — M25512 Pain in left shoulder: Secondary | ICD-10-CM | POA: Diagnosis not present

## 2020-07-30 DIAGNOSIS — M25532 Pain in left wrist: Secondary | ICD-10-CM | POA: Diagnosis present

## 2020-07-30 DIAGNOSIS — G8929 Other chronic pain: Secondary | ICD-10-CM | POA: Diagnosis present

## 2020-07-30 NOTE — Therapy (Signed)
[facility] [address] [place], [address] Phone: [phone]   Fax:  [phone]  Physical Therapy Evaluation  Patient Details  Name: Margaret Potts MRN: [id number] Date of Birth: 1956/01/31 No data recorded  Encounter Date: 07/30/2020   PT End of Session - 07/30/20 1512    Visit Number 1    Number of Visits 17    Date for PT Re-Evaluation 09/26/20    Authorization - Visit Number 1    Authorization - Number of Visits 17    PT Start Time 5916    PT Stop Time 1430    PT Time Calculation (min) 45 min    Activity Tolerance Patient tolerated treatment well;Patient limited by pain    Behavior During Therapy [facility] for tasks assessed/performed           Past Medical History:  Diagnosis Date  . Arthritis   . Breast cancer ([place])    RT with mastectomy in 1995  . Cognitive deficits   . Depression   . Hyperlipemia   . Hypertension   . Lung cancer ([place])    RT upper lobe removed in OCT 2015  . Lung nodule   . Memory loss   . Seizures ([place])     Past Surgical History:  Procedure Laterality Date  . BREAST BIOPSY Left    benign  . BREAST BIOPSY Left 05/27/2017   [place] core ribbon REACTIVE FIBROSIS CONSISTENT WITH SCAR.   Marland Kitchen COLONOSCOPY WITH PROPOFOL N/A 02/09/2017   Procedure: COLONOSCOPY WITH PROPOFOL;  Surgeon: Jonathon Bellows, MD;  Location: [place];  Service: Gastroenterology;  Laterality: N/A;  . ESOPHAGOGASTRODUODENOSCOPY N/A 02/09/2017   Procedure: ESOPHAGOGASTRODUODENOSCOPY (EGD);  Surgeon: Jonathon Bellows, MD;  Location: [place];  Service: Gastroenterology;  Laterality: N/A;  . LUNG LOBECTOMY    . MASTECTOMY Right   . POLYPECTOMY N/A 02/09/2017   Procedure: POLYPECTOMY;  Surgeon: Jonathon Bellows, MD;  Location: [place];  Service: Gastroenterology;  Laterality: N/A;  . right mastectomy      There were no vitals filed for this visit.    Subjective Assessment - 07/30/20 1457     Subjective Pt is 65 yo female with c/c left shoulder pain running primarily posterolaterally and left wrist pain on ulnar side. Pain began two months ago with insidious onset. Current pain is a 5/10 Worst 7/10 Best 4/10. Pain is aggrevated with raising overhead and reaching across body and with lifting. Able to ease pain with heat packs and biofreeze and doctor prescribed medicine. Pain is better in morning and worst through the night, unable to sleep on shoulder but is able to move out of position to go back to sleep. 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 Patient would like to be able to get back to full duty and golf without any pain (lifting 20# or more). History of R DeQuervians (seen by OT at this clinic) last year with L shoulder pain developing following; per patient possibly overuse from using L side more than R. Same pain in R wrist at this time, patient wears bilat braces during work day to ease pain. Denies N/V, fever, night sweats, unrelenting pain that awakens her from sleep, unexplained weight loss/gains, falls in past 58months.    Limitations Lifting    How long can you sit comfortably? unlimited    How long can you stand comfortably? unlimited  How long can you walk comfortably? unlimited    Diagnostic tests Xrays normal    Currently in Pain? Yes    Pain Score 5     Pain Location Shoulder    Pain Orientation Left    Pain Descriptors / Indicators Dull;Constant    Pain Type Chronic pain    Pain Onset More than a month ago    Pain Frequency Intermittent    Aggravating Factors  Reaching overhead/across body and with lifting    Pain Relieving Factors Heat pack and Biofreeze    Effect of Pain on Daily Activities unable to work full duty    Multiple Pain Sites --             Shoulder Eval ELBOW SCREEN -  Negative   C-SPINE SCREEN - Spurling negative; full cervical ROM    Posture/Gait - gait WNL; posture FHRS Observation:  scapular dyskinesis with overhead movement; shoulder hiking with abd, guarded throughout all AROM/PROM  Palpation/Sensation -  TTP posterolaterally (scapular spine and post GHJ) along left shoulder; TTP with trigger points at L UT and levator  FUNCTIONAL TEST - not formally test, impaired ability to reach overhead  AROM/OP FLEX  - R WNL; L 140 EXT - R WNL; L 40  ABD - R WNL; L 90 ER - R WNL; L 40 IR - R WNL; L 10  PROM/OP FLEX - R WNL; L 143 EXT -  R WNL; L 43 ABD - R WNL; L  90 ER - R WNL; L 42 IR -  R WNL; L 12  PAM - Inferior glide hypomobile with no pain, Posterior glide with relief of pain  STRENGTH Flex R WNL; L 4- Ext R WNL; L 4- Abd R WNL; L 4 - ER R WNL; L 5 IR R WNL; L 5 *RTC strength assessed in available ROM Periscapular T  4- bilat [place] Y R 4- L 2+  SPECIAL TESTS Subacromial Impingement Michel Bickers Positive Neer/Painful arc Positive  Resisted ER - Positive Empty/full can Positive Painful Arc: Negative   Rotator Cuff  Drop Arm Test: negative bilat Painful Arc (Pain from 60 to 120 degrees scaption): Negative bilat Infraspinatus Muscle Test: negative bilat  Ther-Ex PT reviewed the following HEP with patient with patient able to demonstrate a set of the following with min cuing for correction needed. PT educated patient on parameters of therex (how/when to inc/decrease intensity, frequency, rep/set range, stretch hold time, and purpose of therex) with verbalized understanding.  Dowel AAROM abd/flex/ER/IR x12 3x/day Scapular retractions x12 every hour with postural education; verbalized understanding     Objective measurements completed on examination: See above findings.               PT Education - 07/30/20 1511    Education Details HEP/Diagnosis/Prognosis    Person(s) Educated Patient    Methods Explanation;Demonstration    Comprehension Verbalized understanding;Returned demonstration            PT Short Term Goals -  07/30/20 1535      PT SHORT TERM GOAL #1   Title Pt will demonstrate full PROM in order to demonstrate joint mobility needed for proper mechanics for overhead strength progression    Baseline Flex 143; Abd 90; ER 42; IR 12   07/30/20    Time 4    Period Weeks    Status New    Target Date 08/27/20      PT SHORT TERM GOAL #2   Title  Pt will be independent with HEP in order to improve strength and ROM in order to decrease fall risk and improve function at home and work.    Baseline 07/30/20 HEP given    Time 8    Period Weeks    Status New             PT Long Term Goals - 07/30/20 1541      PT LONG TERM GOAL #1   Title Pt will decrease pain NPRS by 3 points with overhead movements for significant clinical reduction in pain.    Baseline 07/30/20 Worst 7/10    Time 8    Period Weeks    Status New      PT LONG TERM GOAL #2   Title Pt will demonstrate full shoulder AROM in order to complete overhead ADLs    Baseline 07/30/20 flex: 140 abd 90d ER 40d IR 10d    Time 8    Period Weeks    Status New      PT LONG TERM GOAL #3   Title Pt will demonstrate gross L RTC and periscapular strength of 4/5 in order to complete overhead lifting, pushing/pulling for job duties    Baseline 07/31/19 all RTC and periscapular strength 2+ d/t not able to obtain full AROM    Time 8    Period Weeks    Status New      PT LONG TERM GOAL #4   Title Patient will increase FOTO score to 66 to demonstrate predicted increase in functional mobility to complete ADLs    Baseline 07/31/19 46    Time 8    Period Weeks    Status New      PT LONG TERM GOAL #5   Title Pt will be able to lift 20# box on overhead shelf with pain not exceeding 2/10 in order to demonstrate decreased pain, and safe lifting needed for job    Baseline 07/31/19 unable    Time 8    Period Weeks                  Plan - 07/30/20 1522    Clinical Impression Statement Patient is a 65 y/o female with c/c of chronic left shoulder  pain with secondary left wrist pain on dorsal/ulnar side with insidious onset beginning 2 months ago (following treatment for R DeQuervains, endorses increased LUE use). Examination reveals signs and symptoms of subacromial impingement with possible supra/infraspinatus tendinitis. Impairments in RTC and persicapular strength deficits, limited P/AROM, decreased scapulohumeral rhythm, and pain. 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 Pt will benefit from PT to address impairments to return to optimal PLOF    Personal Factors and Comorbidities Time since onset of injury/illness/exacerbation;Past/Current Experience;Profession;Age;Comorbidity 1;Comorbidity 2;Comorbidity 3+    Comorbidities history of cancer, HLD, HTN    Examination-Activity Limitations Lift;Reach Overhead    Examination-Participation Restrictions Occupation    Stability/Clinical Decision Making Evolving/Moderate complexity    Clinical Decision Making Moderate    Rehab Potential Good    PT Frequency 2x / week    PT Duration 2 weeks    PT Treatment/Interventions ADLs/Self Care Home Management;Cryotherapy;Functional mobility training;Neuromuscular re-education;Therapeutic exercise;Therapeutic activities;Patient/family education;Taping;Dry needling;Manual techniques;Passive range of motion    PT Next Visit Plan Will assess functional task/ HEP review    PT Home Exercise Plan AAROM using dowel to increase flex,abd,ER,IR    Consulted and Agree with Plan of Care Patient  Patient will benefit from skilled therapeutic intervention in order to improve the following deficits and impairments:  Decreased endurance,Decreased mobility,Decreased range of motion,Impaired UE functional use,Impaired flexibility,Decreased strength,Decreased activity tolerance  Visit Diagnosis: Chronic left shoulder pain  Pain in left wrist     Problem List Patient Active  Problem List   Diagnosis Date Noted  . Breast cancer ([place]) 02/02/2017  . Environmental allergies 02/02/2017  . Menopausal syndrome 02/02/2017  . Chronic left hip pain 12/28/2016  . Left upper arm pain 12/28/2016  . Right foot pain 10/26/2016  . Greater trochanteric bursitis of left hip 08/03/2016  . Bilateral hand pain 04/27/2016  . Hyperlipemia   . Cognitive deficits   . Memory loss   . Lung nodule   . Malignant neoplasm of upper lobe of right lung [facility]) 05/27/2014   Durwin Reges DPT Turner Daniels Durwin Reges 07/30/2020, 5:21 PM  [place] [address] [place], [address] Phone: [phone]   Fax:  [phone]  Name: Margaret Potts MRN: [id number] Date of Birth: Apr 11, 1956

## 2020-08-04 ENCOUNTER — Ambulatory Visit: Payer: Federal, State, Local not specified - PPO | Admitting: Physical Therapy

## 2020-08-04 ENCOUNTER — Encounter: Payer: Self-pay | Admitting: Physical Therapy

## 2020-08-04 ENCOUNTER — Other Ambulatory Visit: Payer: Self-pay

## 2020-08-04 DIAGNOSIS — M25512 Pain in left shoulder: Secondary | ICD-10-CM | POA: Diagnosis not present

## 2020-08-04 DIAGNOSIS — G8929 Other chronic pain: Secondary | ICD-10-CM

## 2020-08-04 NOTE — Therapy (Signed)
[place] [address], [place], [address] Phone: [phone]   Fax:  [phone]  Physical Therapy Treatment  Patient Details  Name: Margaret Potts MRN: [id number] Date of Birth: Mar 13, 1956 No data recorded  Encounter Date: 08/04/2020   PT End of Session - 08/04/20 1555    Visit Number 2    Number of Visits 17    Date for PT Re-Evaluation 09/26/20    Authorization - Visit Number 2    Authorization - Number of Visits 17    PT Start Time 4034    PT Stop Time 1625    PT Time Calculation (min) 40 min    Activity Tolerance Patient tolerated treatment well;Patient limited by pain    Behavior During Therapy [facility] for tasks assessed/performed           Past Medical History:  Diagnosis Date  . Arthritis   . Breast cancer ([place])    RT with mastectomy in 1995  . Cognitive deficits   . Depression   . Hyperlipemia   . Hypertension   . Lung cancer ([place])    RT upper lobe removed in OCT 2015  . Lung nodule   . Memory loss   . Seizures ([place])     Past Surgical History:  Procedure Laterality Date  . BREAST BIOPSY Left    benign  . BREAST BIOPSY Left 05/27/2017   [place] core ribbon REACTIVE FIBROSIS CONSISTENT WITH SCAR.   Marland Kitchen COLONOSCOPY WITH PROPOFOL N/A 02/09/2017   Procedure: COLONOSCOPY WITH PROPOFOL;  Surgeon: Jonathon Bellows, MD;  Location: [place];  Service: Gastroenterology;  Laterality: N/A;  . ESOPHAGOGASTRODUODENOSCOPY N/A 02/09/2017   Procedure: ESOPHAGOGASTRODUODENOSCOPY (EGD);  Surgeon: Jonathon Bellows, MD;  Location: [place];  Service: Gastroenterology;  Laterality: N/A;  . LUNG LOBECTOMY    . MASTECTOMY Right   . POLYPECTOMY N/A 02/09/2017   Procedure: POLYPECTOMY;  Surgeon: Jonathon Bellows, MD;  Location: [place];  Service: Gastroenterology;  Laterality: N/A;  . right mastectomy      There were no vitals filed for this visit.   Subjective Assessment - 08/04/20 1546     Subjective Patient reports some pain and stiffness this morning during work 6/10; pain was able to subside after a couple hours current is 4/10. Reports complience with HEP, some pain and difficulty with it but tolerable.    Pertinent History Pt is 65 yo female with c/c left shoulder pain running primarily posterolaterally and left wrist pain on ulnar side. Pain began two months ago with insidious onset. Current pain is a 5/10 Worst 7/10 Best 4/10. Pain is aggrevated with raising overhead and reaching across body and with lifting. Able to ease pain with heat packs and biofreeze and doctor prescribed medicine. Pain is better in morning and worst through the night, unable to sleep on shoulder but is able to move out of position to go back to sleep. 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 Patient would like to be able to get back to full duty and golf without any pain (lifting 20# or more). History of R DeQuervians (seen by OT at this clinic) last year with L shoulder pain developing following; per patient possibly overuse from using L side more than R. Same pain in R wrist at this time, patient wears bilat braces during work day to ease pain. Denies N/V, fever, night sweats,  unrelenting pain that awakens her from sleep, unexplained weight loss/gains, falls in past 90months.    Limitations Lifting    How long can you sit comfortably? unlimited    How long can you stand comfortably? unlimited    How long can you walk comfortably? unlimited    Diagnostic tests Xrays normal    Pain Onset More than a month ago            Ther-Ex  HEP Review  Pulleys flex / abd for 10 min with cuing for scapulohumeral rhythm and cues to keep cervical in neutral and prevent shoulder hike   Supine AAROM IR <> ER 2x12 2-3sec hold with dowel; minimal ER; min cuing to keep arm flat along table with good carry over   Supine AAROM abd 2x12 2-3sec ; min cuing to keep arm flat  along table with good carry over   Supine AAROM flex 2x12 2-3sec   RTB pull aparts x8; x6; min cuing for scapular retraction  Seated tricep/lat stretch stretch x30sec  Manual  PROM all directions G3 post GHJ mob in 90d abd G3 post GHJ mobs with movement into flex/abd/ER/IR STM to bicep (severe pain to minimal palpation), UT, and pec minor.  Pt guarded mainly with ER                          PT Education - 08/04/20 1555    Education Details therex form/technique; HEP review    Person(s) Educated Patient    Methods Explanation;Demonstration;Verbal cues    Comprehension Verbalized understanding;Returned demonstration;Verbal cues required            PT Short Term Goals - 07/30/20 1535      PT SHORT TERM GOAL #1   Title Pt will demonstrate full PROM in order to demonstrate joint mobility needed for proper mechanics for overhead strength progression    Baseline Flex 143; Abd 90; ER 42; IR 12   07/30/20    Time 4    Period Weeks    Status New    Target Date 08/27/20      PT SHORT TERM GOAL #2   Title Pt will be independent with HEP in order to improve strength and ROM in order to decrease fall risk and improve function at home and work.    Baseline 07/30/20 HEP given    Time 8    Period Weeks    Status New             PT Long Term Goals - 07/30/20 1541      PT LONG TERM GOAL #1   Title Pt will decrease pain NPRS by 3 points with overhead movements for significant clinical reduction in pain.    Baseline 07/30/20 Worst 7/10    Time 8    Period Weeks    Status New      PT LONG TERM GOAL #2   Title Pt will demonstrate full shoulder AROM in order to complete overhead ADLs    Baseline 07/30/20 flex: 140 abd 90d ER 40d IR 10d    Time 8    Period Weeks    Status New      PT LONG TERM GOAL #3   Title Pt will demonstrate gross L RTC and periscapular strength of 4/5 in order to complete overhead lifting, pushing/pulling for job duties    Baseline  07/31/19 all RTC and periscapular strength 2+ d/t not able to obtain full AROM  Time 8    Period Weeks    Status New      PT LONG TERM GOAL #4   Title Patient will increase FOTO score to 66 to demonstrate predicted increase in functional mobility to complete ADLs    Baseline 07/31/19 46    Time 8    Period Weeks    Status New      PT LONG TERM GOAL #5   Title Pt will be able to lift 20# box on overhead shelf with pain not exceeding 2/10 in order to demonstrate decreased pain, and safe lifting needed for job    Baseline 07/31/19 unable    Time 8    Period Weeks                 Plan - 08/04/20 1603    Clinical Impression Statement PT initiated therex for increased shoulder ROM and periscapular strength for overhead reaching with success. Pt was able to complete therex with proper technique with cuing and demonstration with good carry over. Pt was able to tolerate therex with no increases in current pain level and maintained good motivation throughout session. Pt was able to tolerate joint mobilizations with some noticeable increases in PROM. PT will continue to progress as able.    Personal Factors and Comorbidities Time since onset of injury/illness/exacerbation;Past/Current Experience;Profession;Age;Comorbidity 1;Comorbidity 2;Comorbidity 3+    Comorbidities history of cancer, HLD, HTN    Examination-Activity Limitations Lift;Reach Overhead    Examination-Participation Restrictions Occupation    Stability/Clinical Decision Making Evolving/Moderate complexity    Clinical Decision Making Moderate    Rehab Potential Good    PT Frequency 2x / week    PT Duration 2 weeks    PT Treatment/Interventions ADLs/Self Care Home Management;Cryotherapy;Functional mobility training;Neuromuscular re-education;Therapeutic exercise;Therapeutic activities;Patient/family education;Taping;Dry needling;Manual techniques;Passive range of motion    PT Next Visit Plan Will assess functional task/ HEP  review    PT Home Exercise Plan AAROM using dowel to increase flex,abd,ER,IR    Consulted and Agree with Plan of Care Patient           Patient will benefit from skilled therapeutic intervention in order to improve the following deficits and impairments:  Decreased endurance,Decreased mobility,Decreased range of motion,Impaired UE functional use,Impaired flexibility,Decreased strength,Decreased activity tolerance  Visit Diagnosis: Chronic left shoulder pain     Problem List Patient Active Problem List   Diagnosis Date Noted  . Breast cancer ([place]) 02/02/2017  . Environmental allergies 02/02/2017  . Menopausal syndrome 02/02/2017  . Chronic left hip pain 12/28/2016  . Left upper arm pain 12/28/2016  . Right foot pain 10/26/2016  . Greater trochanteric bursitis of left hip 08/03/2016  . Bilateral hand pain 04/27/2016  . Hyperlipemia   . Cognitive deficits   . Memory loss   . Lung nodule   . Malignant neoplasm of upper lobe of right lung [facility]) 05/27/2014   Durwin Reges DPT Turner Daniels Durwin Reges 08/05/2020, 5:03 PM  [place] [facility] [address], [place], [address] Phone: [phone]   Fax:  [phone]  Name: Zamaria Brazzle MRN: [id number] Date of Birth: 1956/01/02

## 2020-08-06 ENCOUNTER — Other Ambulatory Visit: Payer: Self-pay

## 2020-08-06 ENCOUNTER — Encounter: Payer: Self-pay | Admitting: Physical Therapy

## 2020-08-06 ENCOUNTER — Ambulatory Visit: Payer: Federal, State, Local not specified - PPO | Admitting: Physical Therapy

## 2020-08-06 DIAGNOSIS — M25512 Pain in left shoulder: Secondary | ICD-10-CM

## 2020-08-06 DIAGNOSIS — G8929 Other chronic pain: Secondary | ICD-10-CM

## 2020-08-06 NOTE — Therapy (Signed)
[facility] [address] [place], [address] Phone: [phone]   Fax:  [phone]  Physical Therapy Treatment  Patient Details  Name: Margaret Potts MRN: [id number] Date of Birth: [birth year] No data recorded  Encounter Date: 08/06/2020   PT End of Session - 08/06/20 1039    Visit Number 3    Number of Visits 17    Authorization - Visit Number 3    Authorization - Number of Visits 17    PT Start Time 0932    PT Stop Time 1112    PT Time Calculation (min) 40 min    Activity Tolerance Patient tolerated treatment well;Patient limited by pain    Behavior During Therapy [facility] for tasks assessed/performed           Past Medical History:  Diagnosis Date  . Arthritis   . Breast cancer ([place])    RT with mastectomy in 1995  . Cognitive deficits   . Depression   . Hyperlipemia   . Hypertension   . Lung cancer ([place])    RT upper lobe removed in OCT 2015  . Lung nodule   . Memory loss   . Seizures ([place])     Past Surgical History:  Procedure Laterality Date  . BREAST BIOPSY Left    benign  . BREAST BIOPSY Left 05/27/2017   [place] core ribbon REACTIVE FIBROSIS CONSISTENT WITH SCAR.   Marland Kitchen COLONOSCOPY WITH PROPOFOL N/A 02/09/2017   Procedure: COLONOSCOPY WITH PROPOFOL;  Surgeon: Jonathon Bellows, MD;  Location: [facility];  Service: Gastroenterology;  Laterality: N/A;  . ESOPHAGOGASTRODUODENOSCOPY N/A 02/09/2017   Procedure: ESOPHAGOGASTRODUODENOSCOPY (EGD);  Surgeon: Jonathon Bellows, MD;  Location: [facility];  Service: Gastroenterology;  Laterality: N/A;  . LUNG LOBECTOMY    . MASTECTOMY Right   . POLYPECTOMY N/A 02/09/2017   Procedure: POLYPECTOMY;  Surgeon: Jonathon Bellows, MD;  Location: [facility];  Service: Gastroenterology;  Laterality: N/A;  . right mastectomy      There were no vitals filed for this visit.   Subjective Assessment - 08/06/20 1034    Subjective Reports still having some pain  and stiffness this morning 4/10. Reports that shoulder felt better after last session. Reports complience with HEP.    Pertinent History Pt is 65 yo female with c/c left shoulder pain running primarily posterolaterally and left wrist pain on ulnar side. Pain began two months ago with insidious onset. Current pain is a 5/10 Worst 7/10 Best 4/10. Pain is aggrevated with raising overhead and reaching across body and with lifting. Able to ease pain with heat packs and biofreeze and doctor prescribed medicine. Pain is better in morning and worst through the night, unable to sleep on shoulder but is able to move out of position to go back to sleep. 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 Patient would like to be able to get back to full duty and golf without any pain (lifting 20# or more). History of R DeQuervians (seen by OT at this clinic) last year with L shoulder pain developing following; per patient possibly overuse from using L side more than R. Same pain in R wrist at this time, patient wears bilat braces during work day to ease pain. Denies N/V, fever, night sweats, unrelenting pain that awakens her from sleep, unexplained weight loss/gains, falls in past 75months.    Limitations Lifting  How long can you sit comfortably? unlimited    How long can you stand comfortably? unlimited    How long can you walk comfortably? unlimited    Diagnostic tests Xrays normal    Pain Onset More than a month ago                  Ther-Ex  Pulleys flex / abd for 10 min with cuing for scapulohumeral rhythm and cues to keep cervical in neutral and prevent shoulder hike   Supine AAROM IR <> ER 2x12 2-3sec hold with dowel; minimal ER; min cuing to keep arm flat along table with good carry over   Supine AAROM abd 2x12 2-3sec ; min cuing to keep arm flat along table with good carry over   Supine AAROM flex 2x12 2-3sec   Sidelying ER 1#DB 2x10 2-3sec at top,  slow eccentric      Manual  PROM all directions GHJ distraction  G3 post GHJ mob inf/post in 90d abd  G3 post GHJ mobs with movement into flex/abd/ER/IR STM to bicep (severe pain to minimal palpation), UT, and pec minor.  Pt guarded mainly with ER                    PT Education - 08/06/20 1038    Education Details therex form/technique; HEP review    Person(s) Educated Patient    Methods Explanation;Verbal cues    Comprehension Verbalized understanding;Returned demonstration;Verbal cues required            PT Short Term Goals - 07/30/20 1535      PT SHORT TERM GOAL #1   Title Pt will demonstrate full PROM in order to demonstrate joint mobility needed for proper mechanics for overhead strength progression    Baseline Flex 143; Abd 90; ER 42; IR 12   07/30/20    Time 4    Period Weeks    Status New    Target Date 08/27/20      PT SHORT TERM GOAL #2   Title Pt will be independent with HEP in order to improve strength and ROM in order to decrease fall risk and improve function at home and work.    Baseline 07/30/20 HEP given    Time 8    Period Weeks    Status New             PT Long Term Goals - 07/30/20 1541      PT LONG TERM GOAL #1   Title Pt will decrease pain NPRS by 3 points with overhead movements for significant clinical reduction in pain.    Baseline 07/30/20 Worst 7/10    Time 8    Period Weeks    Status New      PT LONG TERM GOAL #2   Title Pt will demonstrate full shoulder AROM in order to complete overhead ADLs    Baseline 07/30/20 flex: 140 abd 90d ER 40d IR 10d    Time 8    Period Weeks    Status New      PT LONG TERM GOAL #3   Title Pt will demonstrate gross L RTC and periscapular strength of 4/5 in order to complete overhead lifting, pushing/pulling for job duties    Baseline 07/31/19 all RTC and periscapular strength 2+ d/t not able to obtain full AROM    Time 8    Period Weeks    Status New      PT LONG TERM GOAL #  4    Title Patient will increase FOTO score to 66 to demonstrate predicted increase in functional mobility to complete ADLs    Baseline 07/31/19 46    Time 8    Period Weeks    Status New      PT LONG TERM GOAL #5   Title Pt will be able to lift 20# box on overhead shelf with pain not exceeding 2/10 in order to demonstrate decreased pain, and safe lifting needed for job    Baseline 07/31/19 unable    Time 8    Period Weeks                 Plan - 08/06/20 1045    Clinical Impression Statement Continued therex progression for increased shoulder ROM and periscapular strength for overhead reaching. Pt was able to comply with cuing and demonstration for proper technqiue. Pt was able tolerate therex and mobilizations with no increases in pain and  good motivation throughout session. PT will continue to progress as able    Personal Factors and Comorbidities Time since onset of injury/illness/exacerbation;Past/Current Experience;Profession;Age;Comorbidity 1;Comorbidity 2;Comorbidity 3+    Comorbidities history of cancer, HLD, HTN    Examination-Activity Limitations Lift;Reach Overhead    Examination-Participation Restrictions Occupation    Stability/Clinical Decision Making Evolving/Moderate complexity    Clinical Decision Making Moderate    Rehab Potential Good    PT Frequency 2x / week    PT Duration 2 weeks    PT Treatment/Interventions ADLs/Self Care Home Management;Cryotherapy;Functional mobility training;Neuromuscular re-education;Therapeutic exercise;Therapeutic activities;Patient/family education;Taping;Dry needling;Manual techniques;Passive range of motion    PT Next Visit Plan Continue POC    PT Home Exercise Plan AAROM using dowel to increase flex,abd,ER,IR    Consulted and Agree with Plan of Care Patient           Patient will benefit from skilled therapeutic intervention in order to improve the following deficits and impairments:  Decreased endurance,Decreased  mobility,Decreased range of motion,Impaired UE functional use,Impaired flexibility,Decreased strength,Decreased activity tolerance  Visit Diagnosis: Chronic left shoulder pain     Problem List Patient Active Problem List   Diagnosis Date Noted  . Breast cancer ([place]) 02/02/2017  . Environmental allergies 02/02/2017  . Menopausal syndrome 02/02/2017  . Chronic left hip pain 12/28/2016  . Left upper arm pain 12/28/2016  . Right foot pain 10/26/2016  . Greater trochanteric bursitis of left hip 08/03/2016  . Bilateral hand pain 04/27/2016  . Hyperlipemia   . Cognitive deficits   . Memory loss   . Lung nodule   . Malignant neoplasm of upper lobe of right lung [facility]) 05/27/2014    Durwin Reges DPT Turner Daniels, SPT  Durwin Reges 08/06/2020, 1:15 PM  [facility] [address] [place], [address] Phone: [phone]   Fax:  [phone]  Name: Margaret Potts MRN: [id number] Date of Birth: 06-04-1956

## 2020-08-07 ENCOUNTER — Ambulatory Visit: Payer: Federal, State, Local not specified - PPO | Admitting: Physical Therapy

## 2020-08-11 ENCOUNTER — Ambulatory Visit: Payer: Federal, State, Local not specified - PPO | Admitting: Physical Therapy

## 2020-08-13 ENCOUNTER — Encounter: Payer: Self-pay | Admitting: Physical Therapy

## 2020-08-13 ENCOUNTER — Ambulatory Visit: Payer: Federal, State, Local not specified - PPO | Attending: Sports Medicine | Admitting: Physical Therapy

## 2020-08-13 ENCOUNTER — Other Ambulatory Visit: Payer: Self-pay

## 2020-08-13 DIAGNOSIS — M25532 Pain in left wrist: Secondary | ICD-10-CM | POA: Diagnosis present

## 2020-08-13 DIAGNOSIS — M654 Radial styloid tenosynovitis [de Quervain]: Secondary | ICD-10-CM | POA: Insufficient documentation

## 2020-08-13 DIAGNOSIS — M25612 Stiffness of left shoulder, not elsewhere classified: Secondary | ICD-10-CM | POA: Insufficient documentation

## 2020-08-13 DIAGNOSIS — M25512 Pain in left shoulder: Secondary | ICD-10-CM | POA: Diagnosis not present

## 2020-08-13 DIAGNOSIS — G8929 Other chronic pain: Secondary | ICD-10-CM | POA: Diagnosis present

## 2020-08-13 DIAGNOSIS — M6281 Muscle weakness (generalized): Secondary | ICD-10-CM | POA: Insufficient documentation

## 2020-08-13 NOTE — Therapy (Signed)
[facility] [address] [place], [address] Phone: [phone]   Fax:  [phone]  Physical Therapy Treatment  Patient Details  Name: Margaret Potts MRN: [id number] Date of Birth: 22-Sep-1955 No data recorded  Encounter Date: 08/13/2020   PT End of Session - 08/13/20 1532    Visit Number 4    Number of Visits 17    Date for PT Re-Evaluation 09/26/20    Authorization - Visit Number 4    Authorization - Number of Visits 17    PT Start Time 5366    PT Stop Time 1605    PT Time Calculation (min) 40 min    Activity Tolerance Patient tolerated treatment well;Patient limited by pain    Behavior During Therapy [facility] for tasks assessed/performed           Past Medical History:  Diagnosis Date  . Arthritis   . Breast cancer ([place])    RT with mastectomy in 1995  . Cognitive deficits   . Depression   . Hyperlipemia   . Hypertension   . Lung cancer ([place])    RT upper lobe removed in OCT 2015  . Lung nodule   . Memory loss   . Seizures ([place])     Past Surgical History:  Procedure Laterality Date  . BREAST BIOPSY Left    benign  . BREAST BIOPSY Left 05/27/2017   [place] core ribbon REACTIVE FIBROSIS CONSISTENT WITH SCAR.   Marland Kitchen COLONOSCOPY WITH PROPOFOL N/A 02/09/2017   Procedure: COLONOSCOPY WITH PROPOFOL;  Surgeon: Jonathon Bellows, MD;  Location: [facility];  Service: Gastroenterology;  Laterality: N/A;  . ESOPHAGOGASTRODUODENOSCOPY N/A 02/09/2017   Procedure: ESOPHAGOGASTRODUODENOSCOPY (EGD);  Surgeon: Jonathon Bellows, MD;  Location: [facility];  Service: Gastroenterology;  Laterality: N/A;  . LUNG LOBECTOMY    . MASTECTOMY Right   . POLYPECTOMY N/A 02/09/2017   Procedure: POLYPECTOMY;  Surgeon: Jonathon Bellows, MD;  Location: [facility];  Service: Gastroenterology;  Laterality: N/A;  . right mastectomy      There were no vitals filed for this visit.   Subjective Assessment - 08/13/20 1526     Subjective Reports still having some pain 4/10 but feels its getting better and moving better than before. Still continuing to do HEP.    Pertinent History Pt is 65 yo female with c/c left shoulder pain running primarily posterolaterally and left wrist pain on ulnar side. Pain began two months ago with insidious onset. Current pain is a 5/10 Worst 7/10 Best 4/10. Pain is aggrevated with raising overhead and reaching across body and with lifting. Able to ease pain with heat packs and biofreeze and doctor prescribed medicine. Pain is better in morning and worst through the night, unable to sleep on shoulder but is able to move out of position to go back to sleep. 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 Patient would like to be able to get back to full duty and golf without any pain (lifting 20# or more). History of R DeQuervians (seen by OT at this clinic) last year with L shoulder pain developing following; per patient possibly overuse from using L side more than R. Same pain in R wrist at this time, patient wears bilat braces during work day to ease pain. Denies N/V, fever, night sweats, unrelenting pain that awakens her from sleep, unexplained weight loss/gains, falls in past 69months.  Limitations Lifting    How long can you sit comfortably? unlimited    How long can you stand comfortably? unlimited    How long can you walk comfortably? unlimited    Diagnostic tests Xrays normal    Pain Onset More than a month ago             Therex Pulleys flex / abd for 10 min with cuing for scapulohumeral Margaret and cues to keep cervical in neutral and prevent shoulder hike  UE ranger walking into flexion 2x10 2-3 sec hold; min cuing to maintain upright posture   Supine AAROM IR <> ER 2x12 2-3sec hold with dowel; minimal ER; min cuing to keep arm flat along table with good carry over   Supine AAROM abd 2x12 2-3sec ; min cuing to keep arm flat along table  with good carry over    Sidelying ER 1#DB 2x10 2-3sec at top, slow eccentric      Manual  PROM all directions GHJ distraction  G3 post GHJ mob inf/post in 90d abd  G3 post GHJ mobs with movement into flex/abd/ER/IR STM to bicep (severe pain to minimal palpation), UT, and pec minor.  Pt guarded mainly with ER                         PT Education - 08/13/20 1531    Education Details therex form/technique; HEP review    Person(s) Educated Patient    Methods Explanation;Demonstration;Verbal cues    Comprehension Verbalized understanding;Returned demonstration;Verbal cues required            PT Short Term Goals - 07/30/20 1535      PT SHORT TERM GOAL #1   Title Pt will demonstrate full PROM in order to demonstrate joint mobility needed for proper mechanics for overhead strength progression    Baseline Flex 143; Abd 90; ER 42; IR 12   07/30/20    Time 4    Period Weeks    Status New    Target Date 08/27/20      PT SHORT TERM GOAL #2   Title Pt will be independent with HEP in order to improve strength and ROM in order to decrease fall risk and improve function at home and work.    Baseline 07/30/20 HEP given    Time 8    Period Weeks    Status New             PT Long Term Goals - 07/30/20 1541      PT LONG TERM GOAL #1   Title Pt will decrease pain NPRS by 3 points with overhead movements for significant clinical reduction in pain.    Baseline 07/30/20 Worst 7/10    Time 8    Period Weeks    Status New      PT LONG TERM GOAL #2   Title Pt will demonstrate full shoulder AROM in order to complete overhead ADLs    Baseline 07/30/20 flex: 140 abd 90d ER 40d IR 10d    Time 8    Period Weeks    Status New      PT LONG TERM GOAL #3   Title Pt will demonstrate gross L RTC and periscapular strength of 4/5 in order to complete overhead lifting, pushing/pulling for job duties    Baseline 07/31/19 all RTC and periscapular strength 2+ d/t not able to obtain  full AROM    Time 8    Period  Weeks    Status New      PT LONG TERM GOAL #4   Title Patient will increase FOTO score to 66 to demonstrate predicted increase in functional mobility to complete ADLs    Baseline 07/31/19 46    Time 8    Period Weeks    Status New      PT LONG TERM GOAL #5   Title Pt will be able to lift 20# box on overhead shelf with pain not exceeding 2/10 in order to demonstrate decreased pain, and safe lifting needed for job    Baseline 07/31/19 unable    Time 8    Period Weeks                 Plan - 08/13/20 1537    Clinical Impression Statement Continued therex progression for increased shoulder ROM and periscapular strength for overhead reaching. pt was able to comply with proper technique with cuing and demonstration with noticeable improvements in motion. Pt was able to tolerate increased grades with mobilizations with tolerable pain and good motivation throughout session. PT will continue to progress as able.    Personal Factors and Comorbidities Time since onset of injury/illness/exacerbation;Past/Current Experience;Profession;Age;Comorbidity 1;Comorbidity 2;Comorbidity 3+    Comorbidities history of cancer, HLD, HTN    Examination-Activity Limitations Lift;Reach Overhead    Examination-Participation Restrictions Occupation    Stability/Clinical Decision Making Evolving/Moderate complexity    Clinical Decision Making Moderate    Rehab Potential Good    PT Frequency 2x / week    PT Duration 8 weeks    PT Treatment/Interventions ADLs/Self Care Home Management;Cryotherapy;Functional mobility training;Neuromuscular re-education;Therapeutic exercise;Therapeutic activities;Patient/family education;Taping;Dry needling;Manual techniques;Passive range of motion    PT Next Visit Plan Continue POC    PT Home Exercise Plan AAROM using dowel to increase flex,abd,ER,IR    Consulted and Agree with Plan of Care Patient           Patient will benefit from skilled  therapeutic intervention in order to improve the following deficits and impairments:  Decreased endurance,Decreased mobility,Decreased range of motion,Impaired UE functional use,Impaired flexibility,Decreased strength,Decreased activity tolerance  Visit Diagnosis: Chronic left shoulder pain  Stiffness of left shoulder, not elsewhere classified     Problem List Patient Active Problem List   Diagnosis Date Noted  . Breast cancer ([place]) 02/02/2017  . Environmental allergies 02/02/2017  . Menopausal syndrome 02/02/2017  . Chronic left hip pain 12/28/2016  . Left upper arm pain 12/28/2016  . Right foot pain 10/26/2016  . Greater trochanteric bursitis of left hip 08/03/2016  . Bilateral hand pain 04/27/2016  . Hyperlipemia   . Cognitive deficits   . Memory loss   . Lung nodule   . Malignant neoplasm of upper lobe of right lung ([place]) 05/27/2014    Durwin Reges DPT .Turner Daniels, SPT  Durwin Reges 08/14/2020, 12:00 PM  [facility] [address] [place], [address] Phone: [phone]   Fax:  [phone]  Name: Margaret Potts MRN: [id number] Date of Birth: [birth year]

## 2020-08-19 ENCOUNTER — Encounter: Payer: Self-pay | Admitting: Physical Therapy

## 2020-08-19 ENCOUNTER — Ambulatory Visit: Payer: Federal, State, Local not specified - PPO | Admitting: Physical Therapy

## 2020-08-19 ENCOUNTER — Other Ambulatory Visit: Payer: Self-pay

## 2020-08-19 DIAGNOSIS — M25512 Pain in left shoulder: Secondary | ICD-10-CM

## 2020-08-19 DIAGNOSIS — G8929 Other chronic pain: Secondary | ICD-10-CM

## 2020-08-19 NOTE — Therapy (Signed)
[place] [address], [place], [address] Phone: [phone]   Fax:  [phone]  Physical Therapy Treatment  Patient Details  Name: Margaret Potts MRN: [id number] Date of Birth: 1956/03/08 No data recorded  Encounter Date: 08/19/2020   PT End of Session - 08/19/20 1619    Visit Number 5    Number of Visits 17    Date for PT Re-Evaluation 09/26/20    Authorization - Visit Number 5    Authorization - Number of Visits 17    PT Start Time 3818    PT Stop Time 1710    PT Time Calculation (min) 55 min    Activity Tolerance Patient tolerated treatment well;Patient limited by pain    Behavior During Therapy [facility] for tasks assessed/performed           Past Medical History:  Diagnosis Date  . Arthritis   . Breast cancer ([place])    RT with mastectomy in 1995  . Cognitive deficits   . Depression   . Hyperlipemia   . Hypertension   . Lung cancer ([place])    RT upper lobe removed in OCT 2015  . Lung nodule   . Memory loss   . Seizures ([place])     Past Surgical History:  Procedure Laterality Date  . BREAST BIOPSY Left    benign  . BREAST BIOPSY Left 05/27/2017   [place] core ribbon REACTIVE FIBROSIS CONSISTENT WITH SCAR.   Marland Kitchen COLONOSCOPY WITH PROPOFOL N/A 02/09/2017   Procedure: COLONOSCOPY WITH PROPOFOL;  Surgeon: Jonathon Bellows, MD;  Location: [place];  Service: Gastroenterology;  Laterality: N/A;  . ESOPHAGOGASTRODUODENOSCOPY N/A 02/09/2017   Procedure: ESOPHAGOGASTRODUODENOSCOPY (EGD);  Surgeon: Jonathon Bellows, MD;  Location: [place];  Service: Gastroenterology;  Laterality: N/A;  . LUNG LOBECTOMY    . MASTECTOMY Right   . POLYPECTOMY N/A 02/09/2017   Procedure: POLYPECTOMY;  Surgeon: Jonathon Bellows, MD;  Location: [place];  Service: Gastroenterology;  Laterality: N/A;  . right mastectomy      There were no vitals filed for this visit.   Subjective Assessment - 08/19/20 1615     Subjective Reports 3/10 pain currently. Continued compliance with HEP    Pertinent History Pt is 65 yo female with c/c left shoulder pain running primarily posterolaterally and left wrist pain on ulnar side. Pain began two months ago with insidious onset. Current pain is a 5/10 Worst 7/10 Best 4/10. Pain is aggrevated with raising overhead and reaching across body and with lifting. Able to ease pain with heat packs and biofreeze and doctor prescribed medicine. Pain is better in morning and worst through the night, unable to sleep on shoulder but is able to move out of position to go back to sleep. 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 Patient would like to be able to get back to full duty and golf without any pain (lifting 20# or more). History of R DeQuervians (seen by OT at this clinic) last year with L shoulder pain developing following; per patient possibly overuse from using L side more than R. Same pain in R wrist at this time, patient wears bilat braces during work day to ease pain. Denies N/V, fever, night sweats, unrelenting pain that awakens her from sleep, unexplained weight loss/gains, falls in past 105months.    Limitations Lifting    How long can you sit  comfortably? unlimited    How long can you stand comfortably? unlimited    How long can you walk comfortably? unlimited    Diagnostic tests Xrays normal    Pain Onset More than a month ago             Therex Pulleys flex / abd for 10 min with cuing for scapulohumeral rhythm and cues to keep cervical in neutral and prevent shoulder hike   UE ranger walking into flexion 3x10 2-3 sec hold; min cuing to maintain upright posture  UE ranger walking into abd 3x6; cuing to not rotate torso  with good carry over   Supine AAROM IR <> ER 2x12 2-3sec hold with dowel; minimal ER; min cuing to keep arm flat along table with good carry over   Supine AAROM abd 2x12 2-3sec ; min cuing to keep arm  flat along table with good carry over  Sitting overhead flexion with dowl 2x10; towel roll placed on upper thoracic  Sitting RTB horizontal abd 2x8         Manual  PROM all directions GHJ distraction  G3 post GHJ mob inf/post in 90d abd  G3 post GHJ mobs with movement into flex/abd/ER/IR STM to bicep (severe pain to minimal palpation), UT, and pec minor.  Pt guarded mainly with ER                         PT Education - 08/19/20 1618    Education Details therex form/technique    Person(s) Educated Patient    Methods Explanation;Demonstration;Verbal cues    Comprehension Verbalized understanding;Returned demonstration;Verbal cues required            PT Short Term Goals - 07/30/20 1535      PT SHORT TERM GOAL #1   Title Pt will demonstrate full PROM in order to demonstrate joint mobility needed for proper mechanics for overhead strength progression    Baseline Flex 143; Abd 90; ER 42; IR 12   07/30/20    Time 4    Period Weeks    Status New    Target Date 08/27/20      PT SHORT TERM GOAL #2   Title Pt will be independent with HEP in order to improve strength and ROM in order to decrease fall risk and improve function at home and work.    Baseline 07/30/20 HEP given    Time 8    Period Weeks    Status New             PT Long Term Goals - 07/30/20 1541      PT LONG TERM GOAL #1   Title Pt will decrease pain NPRS by 3 points with overhead movements for significant clinical reduction in pain.    Baseline 07/30/20 Worst 7/10    Time 8    Period Weeks    Status New      PT LONG TERM GOAL #2   Title Pt will demonstrate full shoulder AROM in order to complete overhead ADLs    Baseline 07/30/20 flex: 140 abd 90d ER 40d IR 10d    Time 8    Period Weeks    Status New      PT LONG TERM GOAL #3   Title Pt will demonstrate gross L RTC and periscapular strength of 4/5 in order to complete overhead lifting, pushing/pulling for job duties    Baseline  07/31/19 all RTC and periscapular strength 2+  d/t not able to obtain full AROM    Time 8    Period Weeks    Status New      PT LONG TERM GOAL #4   Title Patient will increase FOTO score to 66 to demonstrate predicted increase in functional mobility to complete ADLs    Baseline 07/31/19 46    Time 8    Period Weeks    Status New      PT LONG TERM GOAL #5   Title Pt will be able to lift 20# box on overhead shelf with pain not exceeding 2/10 in order to demonstrate decreased pain, and safe lifting needed for job    Baseline 07/31/19 unable    Time 8    Period Weeks                 Plan - 08/19/20 1621    Clinical Impression Statement Continued therex progression for increased shoulder ROM and periscapular strength for overhead reaching. Pt was able to comply with proper technique with cuing and demonstration with continued noticable improvements in motion. Pt had continued concerns for left wrist, PT consulted with OT  about wrist and will send an order to MD for left wrist evaluation. PT will continue to progress therex as able.    Personal Factors and Comorbidities Time since onset of injury/illness/exacerbation;Past/Current Experience;Profession;Age;Comorbidity 1;Comorbidity 2;Comorbidity 3+    Comorbidities history of cancer, HLD, HTN    Examination-Activity Limitations Lift;Reach Overhead    Examination-Participation Restrictions Occupation    Stability/Clinical Decision Making Evolving/Moderate complexity    Clinical Decision Making Moderate    Rehab Potential Good    PT Frequency 2x / week    PT Duration 8 weeks    PT Treatment/Interventions ADLs/Self Care Home Management;Cryotherapy;Functional mobility training;Neuromuscular re-education;Therapeutic exercise;Therapeutic activities;Patient/family education;Taping;Dry needling;Manual techniques;Passive range of motion    PT Next Visit Plan Continue POC    PT Home Exercise Plan AAROM using dowel to increase flex,abd,ER,IR     Consulted and Agree with Plan of Care Patient           Patient will benefit from skilled therapeutic intervention in order to improve the following deficits and impairments:  Decreased endurance,Decreased mobility,Decreased range of motion,Impaired UE functional use,Impaired flexibility,Decreased strength,Decreased activity tolerance  Visit Diagnosis: Chronic left shoulder pain     Problem List Patient Active Problem List   Diagnosis Date Noted  . Breast cancer ([place]) 02/02/2017  . Environmental allergies 02/02/2017  . Menopausal syndrome 02/02/2017  . Chronic left hip pain 12/28/2016  . Left upper arm pain 12/28/2016  . Right foot pain 10/26/2016  . Greater trochanteric bursitis of left hip 08/03/2016  . Bilateral hand pain 04/27/2016  . Hyperlipemia   . Cognitive deficits   . Memory loss   . Lung nodule   . Malignant neoplasm of upper lobe of right lung [facility]) 05/27/2014     Durwin Reges DPT  Turner Daniels, SPT  Durwin Reges 08/20/2020, 8:28 AM  [place] [facility] [address], [place], [address] Phone: [phone]   Fax:  [phone]  Name: Maycee Blasco MRN: [id number] Date of Birth: 19-Nov-1955

## 2020-08-20 ENCOUNTER — Encounter: Payer: Self-pay | Admitting: Physical Therapy

## 2020-08-21 ENCOUNTER — Encounter: Payer: Self-pay | Admitting: Physical Therapy

## 2020-08-21 ENCOUNTER — Other Ambulatory Visit: Payer: Self-pay

## 2020-08-21 ENCOUNTER — Ambulatory Visit: Payer: Federal, State, Local not specified - PPO | Admitting: Physical Therapy

## 2020-08-21 DIAGNOSIS — M25612 Stiffness of left shoulder, not elsewhere classified: Secondary | ICD-10-CM

## 2020-08-21 DIAGNOSIS — G8929 Other chronic pain: Secondary | ICD-10-CM

## 2020-08-21 DIAGNOSIS — M25512 Pain in left shoulder: Secondary | ICD-10-CM | POA: Diagnosis not present

## 2020-08-21 NOTE — Therapy (Signed)
[place] [facility] [address], [place], [address] Phone: [phone]   Fax:  [phone]  Physical Therapy Treatment  Patient Details  Name: Margaret Potts MRN: [id number] Date of Birth: 11-03-1955 No data recorded  Encounter Date: 08/21/2020   PT End of Session - 08/21/20 0920    Visit Number 6    Number of Visits 17    Date for PT Re-Evaluation 09/26/20    Authorization - Visit Number 6    Authorization - Number of Visits 17    PT Start Time 0915    PT Stop Time 0953    PT Time Calculation (min) 38 min    Activity Tolerance Patient tolerated treatment well;Patient limited by pain    Behavior During Therapy [facility] for tasks assessed/performed           Past Medical History:  Diagnosis Date  . Arthritis   . Breast cancer ([place])    RT with mastectomy in 1995  . Cognitive deficits   . Depression   . Hyperlipemia   . Hypertension   . Lung cancer ([place])    RT upper lobe removed in OCT 2015  . Lung nodule   . Memory loss   . Seizures ([place])     Past Surgical History:  Procedure Laterality Date  . BREAST BIOPSY Left    benign  . BREAST BIOPSY Left 05/27/2017   [place] core ribbon REACTIVE FIBROSIS CONSISTENT WITH SCAR.   Marland Kitchen COLONOSCOPY WITH PROPOFOL N/A 02/09/2017   Procedure: COLONOSCOPY WITH PROPOFOL;  Surgeon: Jonathon Bellows, MD;  Location: [place];  Service: Gastroenterology;  Laterality: N/A;  . ESOPHAGOGASTRODUODENOSCOPY N/A 02/09/2017   Procedure: ESOPHAGOGASTRODUODENOSCOPY (EGD);  Surgeon: Jonathon Bellows, MD;  Location: [place];  Service: Gastroenterology;  Laterality: N/A;  . LUNG LOBECTOMY    . MASTECTOMY Right   . POLYPECTOMY N/A 02/09/2017   Procedure: POLYPECTOMY;  Surgeon: Jonathon Bellows, MD;  Location: [place];  Service: Gastroenterology;  Laterality: N/A;  . right mastectomy      There were no vitals filed for this visit.   Subjective Assessment - 08/21/20 0917     Subjective Pt reports minimal pain; mostly just stiffness this morning. Continued to do HEP.    Pertinent History Pt is 65 yo female with c/c left shoulder pain running primarily posterolaterally and left wrist pain on ulnar side. Pain began two months ago with insidious onset. Current pain is a 5/10 Worst 7/10 Best 4/10. Pain is aggrevated with raising overhead and reaching across body and with lifting. Able to ease pain with heat packs and biofreeze and doctor prescribed medicine. Pain is better in morning and worst through the night, unable to sleep on shoulder but is able to move out of position to go back to sleep. 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 Patient would like to be able to get back to full duty and golf without any pain (lifting 20# or more). History of R DeQuervians (seen by OT at this clinic) last year with L shoulder pain developing following; per patient possibly overuse from using L side more than R. Same pain in R wrist at this time, patient wears bilat braces during work day to ease pain. Denies N/V, fever, night sweats, unrelenting pain that awakens her from sleep, unexplained weight loss/gains, falls in past 50months.    Limitations Lifting  How long can you sit comfortably? unlimited    How long can you stand comfortably? unlimited    How long can you walk comfortably? unlimited    Diagnostic tests Xrays normal    Pain Onset More than a month ago           Therex Pulleys flex / abd for 10 min with cuing for scapulohumeral rhythm and cues to keep cervical in neutral and prevent shoulder hike   UE ranger walking into flexion 2x10 2-3 sec hold; min cuing to maintain upright posture   UE ranger walking into abd 2x6; cuing to not rotate torso  with good carry over   Supine AAROM IR <> ER 2x12 2-3sec hold with dowel; minimal ER; min cuing to keep arm flat along table with good carry over   Supine AAROM abd 2x12 2-3sec ;  min cuing to keep arm flat along table with good carry over  Supine overhead flexion with 1# DB 2x10; 2-3 sec hold at top to let weight and gravity pull into more flexion  Sidelying abd 2x8 1#DB; cuing to maintain sidelying position and not to rotate torso           Manual  PROM all directions GHJ distraction  G3 post GHJ mob inf/post in 90d abd  G3 post GHJ mobs with movement into flex/abd/ER/IR STM to bicep ,UT, and pec minor.  Pt guarded mainly with ER                          PT Education - 08/21/20 0919    Education Details therex form/technique    Person(s) Educated Patient    Methods Explanation;Verbal cues;Demonstration    Comprehension Verbalized understanding;Returned demonstration;Verbal cues required            PT Short Term Goals - 07/30/20 1535      PT SHORT TERM GOAL #1   Title Pt will demonstrate full PROM in order to demonstrate joint mobility needed for proper mechanics for overhead strength progression    Baseline Flex 143; Abd 90; ER 42; IR 12   07/30/20    Time 4    Period Weeks    Status New    Target Date 08/27/20      PT SHORT TERM GOAL #2   Title Pt will be independent with HEP in order to improve strength and ROM in order to decrease fall risk and improve function at home and work.    Baseline 07/30/20 HEP given    Time 8    Period Weeks    Status New             PT Long Term Goals - 07/30/20 1541      PT LONG TERM GOAL #1   Title Pt will decrease pain NPRS by 3 points with overhead movements for significant clinical reduction in pain.    Baseline 07/30/20 Worst 7/10    Time 8    Period Weeks    Status New      PT LONG TERM GOAL #2   Title Pt will demonstrate full shoulder AROM in order to complete overhead ADLs    Baseline 07/30/20 flex: 140 abd 90d ER 40d IR 10d    Time 8    Period Weeks    Status New      PT LONG TERM GOAL #3   Title Pt will demonstrate gross L RTC and periscapular strength of 4/5 in  order to complete overhead lifting, pushing/pulling for job duties    Baseline 07/31/19 all RTC and periscapular strength 2+ d/t not able to obtain full AROM    Time 8    Period Weeks    Status New      PT LONG TERM GOAL #4   Title Patient will increase FOTO score to 66 to demonstrate predicted increase in functional mobility to complete ADLs    Baseline 07/31/19 46    Time 8    Period Weeks    Status New      PT LONG TERM GOAL #5   Title Pt will be able to lift 20# box on overhead shelf with pain not exceeding 2/10 in order to demonstrate decreased pain, and safe lifting needed for job    Baseline 07/31/19 unable    Time 8    Period Weeks                 Plan - 08/21/20 4742    Clinical Impression Statement PT continued therex progression for increased shoulder ROM and periscapular strength for overheaad reaching. Pt was able to perform therex with proper technique following demonstration and min cuing. pt continued to tolerated increased joint mobilizations with less pain increases continued improvements with ROM. Pt maintained good motivation throughout session. PT will continue to progress as able    Personal Factors and Comorbidities Time since onset of injury/illness/exacerbation;Past/Current Experience;Profession;Age;Comorbidity 1;Comorbidity 2;Comorbidity 3+    Comorbidities history of cancer, HLD, HTN    Examination-Activity Limitations Lift;Reach Overhead    Examination-Participation Restrictions Occupation    Stability/Clinical Decision Making Evolving/Moderate complexity    Clinical Decision Making Moderate    Rehab Potential Good    PT Frequency 2x / week    PT Duration 8 weeks    PT Treatment/Interventions ADLs/Self Care Home Management;Cryotherapy;Functional mobility training;Neuromuscular re-education;Therapeutic exercise;Therapeutic activities;Patient/family education;Taping;Dry needling;Manual techniques;Passive range of motion    PT Next Visit Plan Continue POC     PT Home Exercise Plan AAROM using dowel to increase flex,abd,ER,IR    Consulted and Agree with Plan of Care Patient           Patient will benefit from skilled therapeutic intervention in order to improve the following deficits and impairments:  Decreased endurance,Decreased mobility,Decreased range of motion,Impaired UE functional use,Impaired flexibility,Decreased strength,Decreased activity tolerance  Visit Diagnosis: Chronic left shoulder pain  Stiffness of left shoulder, not elsewhere classified     Problem List Patient Active Problem List   Diagnosis Date Noted  . Breast cancer ([place]) 02/02/2017  . Environmental allergies 02/02/2017  . Menopausal syndrome 02/02/2017  . Chronic left hip pain 12/28/2016  . Left upper arm pain 12/28/2016  . Right foot pain 10/26/2016  . Greater trochanteric bursitis of left hip 08/03/2016  . Bilateral hand pain 04/27/2016  . Hyperlipemia   . Cognitive deficits   . Memory loss   . Lung nodule   . Malignant neoplasm of upper lobe of right lung [facility]) 05/27/2014     Turner Daniels, SPT  Turner Daniels 08/21/2020, 10:00 AM  [place] [facility] [address], [place], [address] Phone: [phone]   Fax:  [phone]  Name: Elizaveta Mattice MRN: [id number] Date of Birth: 12/08/1955

## 2020-08-21 NOTE — Therapy (Signed)
[facility] [address], [place], [address] Phone: [phone]   Fax:  [phone]  Physical Therapy Treatment  Patient Details  Name: Margaret Potts MRN: [id number] Date of Birth: [birth year] No data recorded  Encounter Date: 08/21/2020   PT End of Session - 08/21/20 0920    Visit Number 6    Number of Visits 17    Date for PT Re-Evaluation 09/26/20    Authorization - Visit Number 6    Authorization - Number of Visits 17    PT Start Time 0915    PT Stop Time 0953    PT Time Calculation (min) 38 min    Activity Tolerance Patient tolerated treatment well;Patient limited by pain    Behavior During Therapy [facility] for tasks assessed/performed           Past Medical History:  Diagnosis Date  . Arthritis   . Breast cancer ([place])    RT with mastectomy in 1995  . Cognitive deficits   . Depression   . Hyperlipemia   . Hypertension   . Lung cancer ([place])    RT upper lobe removed in OCT 2015  . Lung nodule   . Memory loss   . Seizures ([place])     Past Surgical History:  Procedure Laterality Date  . BREAST BIOPSY Left    benign  . BREAST BIOPSY Left 05/27/2017   [place] core ribbon REACTIVE FIBROSIS CONSISTENT WITH SCAR.   Marland Kitchen COLONOSCOPY WITH PROPOFOL N/A 02/09/2017   Procedure: COLONOSCOPY WITH PROPOFOL;  Surgeon: Jonathon Bellows, MD;  Location: [place];  Service: Gastroenterology;  Laterality: N/A;  . ESOPHAGOGASTRODUODENOSCOPY N/A 02/09/2017   Procedure: ESOPHAGOGASTRODUODENOSCOPY (EGD);  Surgeon: Jonathon Bellows, MD;  Location: [place];  Service: Gastroenterology;  Laterality: N/A;  . LUNG LOBECTOMY    . MASTECTOMY Right   . POLYPECTOMY N/A 02/09/2017   Procedure: POLYPECTOMY;  Surgeon: Jonathon Bellows, MD;  Location: [place];  Service: Gastroenterology;  Laterality: N/A;  . right mastectomy      There were no vitals filed for this visit.   Subjective Assessment - 08/21/20 0917     Subjective Pt reports minimal pain; mostly just stiffness this morning. Continued to do HEP.    Pertinent History Pt is 65 yo female with c/c left shoulder pain running primarily posterolaterally and left wrist pain on ulnar side. Pain began two months ago with insidious onset. Current pain is a 5/10 Worst 7/10 Best 4/10. Pain is aggrevated with raising overhead and reaching across body and with lifting. Able to ease pain with heat packs and biofreeze and doctor prescribed medicine. Pain is better in morning and worst through the night, unable to sleep on shoulder but is able to move out of position to go back to sleep. 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 Patient would like to be able to get back to full duty and golf without any pain (lifting 20# or more). History of R DeQuervians (seen by OT at this clinic) last year with L shoulder pain developing following; per patient possibly overuse from using L side more than R. Same pain in R wrist at this time, patient wears bilat braces during work day to ease pain. Denies N/V, fever, night sweats, unrelenting pain that awakens her from sleep, unexplained weight loss/gains, falls in past 20months.    Limitations Lifting  How long can you sit comfortably? unlimited    How long can you stand comfortably? unlimited    How long can you walk comfortably? unlimited    Diagnostic tests Xrays normal    Pain Onset More than a month ago                                     PT Education - 08/21/20 0919    Education Details therex form/technique    Person(s) Educated Patient    Methods Explanation;Verbal cues;Demonstration    Comprehension Verbalized understanding;Returned demonstration;Verbal cues required            PT Short Term Goals - 07/30/20 1535      PT SHORT TERM GOAL #1   Title Pt will demonstrate full PROM in order to demonstrate joint mobility needed for proper  mechanics for overhead strength progression    Baseline Flex 143; Abd 90; ER 42; IR 12   07/30/20    Time 4    Period Weeks    Status New    Target Date 08/27/20      PT SHORT TERM GOAL #2   Title Pt will be independent with HEP in order to improve strength and ROM in order to decrease fall risk and improve function at home and work.    Baseline 07/30/20 HEP given    Time 8    Period Weeks    Status New             PT Long Term Goals - 07/30/20 1541      PT LONG TERM GOAL #1   Title Pt will decrease pain NPRS by 3 points with overhead movements for significant clinical reduction in pain.    Baseline 07/30/20 Worst 7/10    Time 8    Period Weeks    Status New      PT LONG TERM GOAL #2   Title Pt will demonstrate full shoulder AROM in order to complete overhead ADLs    Baseline 07/30/20 flex: 140 abd 90d ER 40d IR 10d    Time 8    Period Weeks    Status New      PT LONG TERM GOAL #3   Title Pt will demonstrate gross L RTC and periscapular strength of 4/5 in order to complete overhead lifting, pushing/pulling for job duties    Baseline 07/31/19 all RTC and periscapular strength 2+ d/t not able to obtain full AROM    Time 8    Period Weeks    Status New      PT LONG TERM GOAL #4   Title Patient will increase FOTO score to 66 to demonstrate predicted increase in functional mobility to complete ADLs    Baseline 07/31/19 46    Time 8    Period Weeks    Status New      PT LONG TERM GOAL #5   Title Pt will be able to lift 20# box on overhead shelf with pain not exceeding 2/10 in order to demonstrate decreased pain, and safe lifting needed for job    Baseline 07/31/19 unable    Time 8    Period Weeks                 Plan - 08/21/20 5732    Clinical Impression Statement PT continued therex progression for increased shoulder ROM and periscapular strength for overheaad reaching.  Pt was able to perform therex with proper technique following demonstration and min cuing. pt  continued to tolerated increased joint mobilizations with less pain increases continued improvements with ROM. Pt maintained good motivation throughout session. PT will continue to progress as able    Personal Factors and Comorbidities Time since onset of injury/illness/exacerbation;Past/Current Experience;Profession;Age;Comorbidity 1;Comorbidity 2;Comorbidity 3+    Comorbidities history of cancer, HLD, HTN    Examination-Activity Limitations Lift;Reach Overhead    Examination-Participation Restrictions Occupation    Stability/Clinical Decision Making Evolving/Moderate complexity    Clinical Decision Making Moderate    Rehab Potential Good    PT Frequency 2x / week    PT Duration 8 weeks    PT Treatment/Interventions ADLs/Self Care Home Management;Cryotherapy;Functional mobility training;Neuromuscular re-education;Therapeutic exercise;Therapeutic activities;Patient/family education;Taping;Dry needling;Manual techniques;Passive range of motion    PT Next Visit Plan Continue POC    PT Home Exercise Plan AAROM using dowel to increase flex,abd,ER,IR    Consulted and Agree with Plan of Care Patient           Patient will benefit from skilled therapeutic intervention in order to improve the following deficits and impairments:  Decreased endurance,Decreased mobility,Decreased range of motion,Impaired UE functional use,Impaired flexibility,Decreased strength,Decreased activity tolerance  Visit Diagnosis: Chronic left shoulder pain  Stiffness of left shoulder, not elsewhere classified     Problem List Patient Active Problem List   Diagnosis Date Noted  . Breast cancer ([place]) 02/02/2017  . Environmental allergies 02/02/2017  . Menopausal syndrome 02/02/2017  . Chronic left hip pain 12/28/2016  . Left upper arm pain 12/28/2016  . Right foot pain 10/26/2016  . Greater trochanteric bursitis of left hip 08/03/2016  . Bilateral hand pain 04/27/2016  . Hyperlipemia   . Cognitive deficits   .  Memory loss   . Lung nodule   . Malignant neoplasm of upper lobe of right lung ([place]) 05/27/2014   Durwin Reges DPT Durwin Reges 08/21/2020, 1:49 PM  [facility] [address] [place], [address] Phone: [phone]   Fax:  [phone]  Name: Janaiya Beauchesne MRN: [id number] Date of Birth: Dec 14, 1955

## 2020-08-26 ENCOUNTER — Ambulatory Visit: Payer: Federal, State, Local not specified - PPO | Admitting: Physical Therapy

## 2020-08-26 ENCOUNTER — Other Ambulatory Visit: Payer: Self-pay

## 2020-08-26 ENCOUNTER — Encounter: Payer: Self-pay | Admitting: Physical Therapy

## 2020-08-26 DIAGNOSIS — M25612 Stiffness of left shoulder, not elsewhere classified: Secondary | ICD-10-CM

## 2020-08-26 DIAGNOSIS — G8929 Other chronic pain: Secondary | ICD-10-CM

## 2020-08-26 DIAGNOSIS — M25512 Pain in left shoulder: Secondary | ICD-10-CM

## 2020-08-26 NOTE — Therapy (Signed)
[facility] [address], [place], [address] Phone: [phone]   Fax:  [phone]  Physical Therapy Treatment  Patient Details  Name: Margaret Potts MRN: [id number] Date of Birth: Dec 26, 1955 No data recorded  Encounter Date: 08/26/2020   PT End of Session - 08/26/20 1615    Visit Number 7    Number of Visits 17    Date for PT Re-Evaluation 09/26/20    Authorization - Visit Number 7    Authorization - Number of Visits 17    PT Start Time 0910    PT Stop Time 0950    PT Time Calculation (min) 40 min    Activity Tolerance Patient tolerated treatment well;Patient limited by pain    Behavior During Therapy [facility] for tasks assessed/performed           Past Medical History:  Diagnosis Date  . Arthritis   . Breast cancer ([place])    RT with mastectomy in 1995  . Cognitive deficits   . Depression   . Hyperlipemia   . Hypertension   . Lung cancer ([place])    RT upper lobe removed in OCT 2015  . Lung nodule   . Memory loss   . Seizures ([place])     Past Surgical History:  Procedure Laterality Date  . BREAST BIOPSY Left    benign  . BREAST BIOPSY Left 05/27/2017   [place] core ribbon REACTIVE FIBROSIS CONSISTENT WITH SCAR.   Marland Kitchen COLONOSCOPY WITH PROPOFOL N/A 02/09/2017   Procedure: COLONOSCOPY WITH PROPOFOL;  Surgeon: Jonathon Bellows, MD;  Location: [place];  Service: Gastroenterology;  Laterality: N/A;  . ESOPHAGOGASTRODUODENOSCOPY N/A 02/09/2017   Procedure: ESOPHAGOGASTRODUODENOSCOPY (EGD);  Surgeon: Jonathon Bellows, MD;  Location: [place];  Service: Gastroenterology;  Laterality: N/A;  . LUNG LOBECTOMY    . MASTECTOMY Right   . POLYPECTOMY N/A 02/09/2017   Procedure: POLYPECTOMY;  Surgeon: Jonathon Bellows, MD;  Location: [place];  Service: Gastroenterology;  Laterality: N/A;  . right mastectomy      There were no vitals filed for this visit.   Subjective Assessment - 08/26/20 1611     Subjective Pt reports some shoulder pain, 4/10 currently. Continued compliance with HEP.    Pertinent History Pt is 65 yo female with c/c left shoulder pain running primarily posterolaterally and left wrist pain on ulnar side. Pain began two months ago with insidious onset. Current pain is a 5/10 Worst 7/10 Best 4/10. Pain is aggrevated with raising overhead and reaching across body and with lifting. Able to ease pain with heat packs and biofreeze and doctor prescribed medicine. Pain is better in morning and worst through the night, unable to sleep on shoulder but is able to move out of position to go back to sleep. 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 Patient would like to be able to get back to full duty and golf without any pain (lifting 20# or more). History of R DeQuervians (seen by OT at this clinic) last year with L shoulder pain developing following; per patient possibly overuse from using L side more than R. Same pain in R wrist at this time, patient wears bilat braces during work day to ease pain. Denies N/V, fever, night sweats, unrelenting pain that awakens her from sleep, unexplained weight loss/gains, falls in past 28months.    Limitations Lifting    How long  can you sit comfortably? unlimited    How long can you stand comfortably? unlimited    How long can you walk comfortably? unlimited    Diagnostic tests Xrays normal    Pain Onset More than a month ago           Therex Pulleys flex / abd for 5 min with cuing for scapulohumeral rhythm and cues to keep cervical in neutral and prevent shoulder hike  TRX overhead flexion walk 3x10 ; min cuing to maintain elbow ext and not to lean trunk backwards  Seated military press 3x6 2# DB   Seated RTB pull apart 3x 10; cuing to prevent shoulder hike   Seated overhead scaption RTB 2x10   Sidelying abd 2x8 1#DB; cuing to maintain sidelying position and not to rotate torso     Manual   PROM all directions GHJ distraction  G3 post GHJ mob inf/post in 90d abd  G3 post GHJ mobs with movement into flex/abd/ER/IR STM to bicep ,UT, and pec minor.  Pt guarded mainly with ER                          PT Education - 08/26/20 1615    Education Details therex form/technique    Person(s) Educated Patient    Methods Explanation;Demonstration;Verbal cues    Comprehension Verbalized understanding;Returned demonstration;Verbal cues required            PT Short Term Goals - 07/30/20 1535      PT SHORT TERM GOAL #1   Title Pt will demonstrate full PROM in order to demonstrate joint mobility needed for proper mechanics for overhead strength progression    Baseline Flex 143; Abd 90; ER 42; IR 12   07/30/20    Time 4    Period Weeks    Status New    Target Date 08/27/20      PT SHORT TERM GOAL #2   Title Pt will be independent with HEP in order to improve strength and ROM in order to decrease fall risk and improve function at home and work.    Baseline 07/30/20 HEP given    Time 8    Period Weeks    Status New             PT Long Term Goals - 07/30/20 1541      PT LONG TERM GOAL #1   Title Pt will decrease pain NPRS by 3 points with overhead movements for significant clinical reduction in pain.    Baseline 07/30/20 Worst 7/10    Time 8    Period Weeks    Status New      PT LONG TERM GOAL #2   Title Pt will demonstrate full shoulder AROM in order to complete overhead ADLs    Baseline 07/30/20 flex: 140 abd 90d ER 40d IR 10d    Time 8    Period Weeks    Status New      PT LONG TERM GOAL #3   Title Pt will demonstrate gross L RTC and periscapular strength of 4/5 in order to complete overhead lifting, pushing/pulling for job duties    Baseline 07/31/19 all RTC and periscapular strength 2+ d/t not able to obtain full AROM    Time 8    Period Weeks    Status New      PT LONG TERM GOAL #4   Title Patient will increase FOTO score to 66 to  demonstrate  predicted increase in functional mobility to complete ADLs    Baseline 07/31/19 46    Time 8    Period Weeks    Status New      PT LONG TERM GOAL #5   Title Pt will be able to lift 20# box on overhead shelf with pain not exceeding 2/10 in order to demonstrate decreased pain, and safe lifting needed for job    Baseline 07/31/19 unable    Time 8    Period Weeks                 Plan - 08/26/20 1623    Clinical Impression Statement PT continued therex progression for  increased shoulder ROM and strength and periscapular strength for carry over into overhead lifting and reaching. Pt was able to perform all therex with proper technique following demonstration and min cuing. Pt continues to tolerate increased joint mobilizations and resistance with therex. Pt had no increases in pain and good motivation throughout session. PT will continue to progress as able.    Personal Factors and Comorbidities Time since onset of injury/illness/exacerbation;Past/Current Experience;Profession;Age;Comorbidity 1;Comorbidity 2;Comorbidity 3+    Comorbidities history of cancer, HLD, HTN    Examination-Activity Limitations Lift;Reach Overhead    Examination-Participation Restrictions Occupation    Stability/Clinical Decision Making Evolving/Moderate complexity    Clinical Decision Making Moderate    Rehab Potential Good    PT Frequency 2x / week    PT Duration 8 weeks    PT Treatment/Interventions ADLs/Self Care Home Management;Cryotherapy;Functional mobility training;Neuromuscular re-education;Therapeutic exercise;Therapeutic activities;Patient/family education;Taping;Dry needling;Manual techniques;Passive range of motion    PT Next Visit Plan Continue POC    PT Home Exercise Plan AAROM using dowel to increase flex,abd,ER,IR    Consulted and Agree with Plan of Care Patient           Patient will benefit from skilled therapeutic intervention in order to improve the following deficits and  impairments:  Decreased endurance,Decreased mobility,Decreased range of motion,Impaired UE functional use,Impaired flexibility,Decreased strength,Decreased activity tolerance  Visit Diagnosis: Chronic left shoulder pain  Stiffness of left shoulder, not elsewhere classified     Problem List Patient Active Problem List   Diagnosis Date Noted  . Breast cancer ([place]) 02/02/2017  . Environmental allergies 02/02/2017  . Menopausal syndrome 02/02/2017  . Chronic left hip pain 12/28/2016  . Left upper arm pain 12/28/2016  . Right foot pain 10/26/2016  . Greater trochanteric bursitis of left hip 08/03/2016  . Bilateral hand pain 04/27/2016  . Hyperlipemia   . Cognitive deficits   . Memory loss   . Lung nodule   . Malignant neoplasm of upper lobe of right lung [facility]) 05/27/2014   Durwin Reges DPT Turner Daniels, SPT  Durwin Reges 08/26/2020, 5:19 PM  [facility] [address] [place], [address] Phone: [phone]   Fax:  [phone]  Name: Margaret Potts MRN: [id number] Date of Birth: 08-24-1955

## 2020-08-28 ENCOUNTER — Encounter: Payer: Self-pay | Admitting: Physical Therapy

## 2020-08-28 ENCOUNTER — Ambulatory Visit: Payer: Federal, State, Local not specified - PPO | Admitting: Physical Therapy

## 2020-08-28 ENCOUNTER — Other Ambulatory Visit: Payer: Self-pay

## 2020-08-28 DIAGNOSIS — M25612 Stiffness of left shoulder, not elsewhere classified: Secondary | ICD-10-CM

## 2020-08-28 DIAGNOSIS — G8929 Other chronic pain: Secondary | ICD-10-CM

## 2020-08-28 DIAGNOSIS — M25512 Pain in left shoulder: Secondary | ICD-10-CM | POA: Diagnosis not present

## 2020-08-28 NOTE — Therapy (Signed)
[place] [address], [place], [address] Phone: [phone]   Fax:  [phone]  Physical Therapy Treatment  Patient Details  Name: Margaret Potts MRN: [id number] Date of Birth: 08-18-1955 No data recorded  Encounter Date: 08/28/2020   PT End of Session - 08/28/20 1449    Visit Number 8    Number of Visits 17    Date for PT Re-Evaluation 09/26/20    Authorization - Visit Number 8    Authorization - Number of Visits 17    PT Start Time 7591    PT Stop Time 1525    PT Time Calculation (min) 40 min    Activity Tolerance Patient tolerated treatment well;Patient limited by pain    Behavior During Therapy [facility] for tasks assessed/performed           Past Medical History:  Diagnosis Date  . Arthritis   . Breast cancer ([place])    RT with mastectomy in 1995  . Cognitive deficits   . Depression   . Hyperlipemia   . Hypertension   . Lung cancer ([place])    RT upper lobe removed in OCT 2015  . Lung nodule   . Memory loss   . Seizures ([place])     Past Surgical History:  Procedure Laterality Date  . BREAST BIOPSY Left    benign  . BREAST BIOPSY Left 05/27/2017   [place] core ribbon REACTIVE FIBROSIS CONSISTENT WITH SCAR.   Marland Kitchen COLONOSCOPY WITH PROPOFOL N/A 02/09/2017   Procedure: COLONOSCOPY WITH PROPOFOL;  Surgeon: Jonathon Bellows, MD;  Location: [place];  Service: Gastroenterology;  Laterality: N/A;  . ESOPHAGOGASTRODUODENOSCOPY N/A 02/09/2017   Procedure: ESOPHAGOGASTRODUODENOSCOPY (EGD);  Surgeon: Jonathon Bellows, MD;  Location: [place];  Service: Gastroenterology;  Laterality: N/A;  . LUNG LOBECTOMY    . MASTECTOMY Right   . POLYPECTOMY N/A 02/09/2017   Procedure: POLYPECTOMY;  Surgeon: Jonathon Bellows, MD;  Location: [place];  Service: Gastroenterology;  Laterality: N/A;  . right mastectomy      There were no vitals filed for this visit.   Subjective Assessment - 08/28/20 1444     Subjective Pt has some shoulder pain, currently 3/10. Reports that she feels her shoulder is moving better. Continues to complete HEP    Pertinent History Pt is 65 yo female with c/c left shoulder pain running primarily posterolaterally and left wrist pain on ulnar side. Pain began two months ago with insidious onset. Current pain is a 5/10 Worst 7/10 Best 4/10. Pain is aggrevated with raising overhead and reaching across body and with lifting. Able to ease pain with heat packs and biofreeze and doctor prescribed medicine. Pain is better in morning and worst through the night, unable to sleep on shoulder but is able to move out of position to go back to sleep. 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 Patient would like to be able to get back to full duty and golf without any pain (lifting 20# or more). History of R DeQuervians (seen by OT at this clinic) last year with L shoulder pain developing following; per patient possibly overuse from using L side more than R. Same pain in R wrist at this time, patient wears bilat braces during work day to ease pain. Denies N/V, fever, night sweats, unrelenting pain that awakens her from sleep, unexplained weight loss/gains, falls in past 30months.  Limitations Lifting    How long can you sit comfortably? unlimited    How long can you stand comfortably? unlimited    How long can you walk comfortably? unlimited    Diagnostic tests Xrays normal    Pain Onset More than a month ago              Therex Pulleys flex / abd for 5 min with cuing for scapulohumeral rhythm and cues to keep cervical in neutral and prevent shoulder hike   TRX overhead flexion walk 2x12 ; min cuing to maintain elbow ext and not to lean trunk backwards  TRX overhead abd walk 2x12; cuing to not rotate torse and prevent shoulder hike  ER towel stretch 2x12   Seated overhead press 3x5 5# AW wrapped around dowel; w/ towel roll at upper  thoracic    Seated RTB pull apart 3x 10; cuing to prevent shoulder hike    Sidelying ER 2x 8 2# DB; w/ towel roll at elbow and cuing to maintain neutral sidelying   Sidelying abd 2x8 1#DB; cuing to maintain sidelying position and not to rotate torso     Manual  PROM all directions GHJ distraction  G3-4 post GHJ mob inf/post in 90d abd  G3-4 post GHJ mobs with movement into flex/abd/ER/IR STM to bicep ,UT, and pec minor.  Pt guarded mainly with ER                        PT Education - 08/28/20 1449    Education Details therex form/technique    Person(s) Educated Patient    Methods Explanation;Demonstration;Verbal cues    Comprehension Verbalized understanding;Returned demonstration;Verbal cues required            PT Short Term Goals - 07/30/20 1535      PT SHORT TERM GOAL #1   Title Pt will demonstrate full PROM in order to demonstrate joint mobility needed for proper mechanics for overhead strength progression    Baseline Flex 143; Abd 90; ER 42; IR 12   07/30/20    Time 4    Period Weeks    Status New    Target Date 08/27/20      PT SHORT TERM GOAL #2   Title Pt will be independent with HEP in order to improve strength and ROM in order to decrease fall risk and improve function at home and work.    Baseline 07/30/20 HEP given    Time 8    Period Weeks    Status New             PT Long Term Goals - 07/30/20 1541      PT LONG TERM GOAL #1   Title Pt will decrease pain NPRS by 3 points with overhead movements for significant clinical reduction in pain.    Baseline 07/30/20 Worst 7/10    Time 8    Period Weeks    Status New      PT LONG TERM GOAL #2   Title Pt will demonstrate full shoulder AROM in order to complete overhead ADLs    Baseline 07/30/20 flex: 140 abd 90d ER 40d IR 10d    Time 8    Period Weeks    Status New      PT LONG TERM GOAL #3   Title Pt will demonstrate gross L RTC and periscapular strength of 4/5 in order to  complete overhead lifting, pushing/pulling for job duties  Baseline 07/31/19 all RTC and periscapular strength 2+ d/t not able to obtain full AROM    Time 8    Period Weeks    Status New      PT LONG TERM GOAL #4   Title Patient will increase FOTO score to 66 to demonstrate predicted increase in functional mobility to complete ADLs    Baseline 07/31/19 46    Time 8    Period Weeks    Status New      PT LONG TERM GOAL #5   Title Pt will be able to lift 20# box on overhead shelf with pain not exceeding 2/10 in order to demonstrate decreased pain, and safe lifting needed for job    Baseline 07/31/19 unable    Time 8    Period Weeks                 Plan - 08/28/20 1451    Clinical Impression Statement PT continued therex progression for increased shoulder ROM and strength and periscapular strength for carry over into overhead lifting. Pt was able to perform all therex with proper technique following demonstration and min cuing. Pt continues to have more noticabile improvements with motion and continues to tolerate increased joint mobilizations and reps/resistance with therex. Pt had some tolerable increases in pain and continued good motivation throughout. PT will continue to progress as able.    Personal Factors and Comorbidities Time since onset of injury/illness/exacerbation;Past/Current Experience;Profession;Age;Comorbidity 1;Comorbidity 2;Comorbidity 3+    Comorbidities history of cancer, HLD, HTN    Examination-Activity Limitations Lift;Reach Overhead    Examination-Participation Restrictions Occupation    Stability/Clinical Decision Making Evolving/Moderate complexity    Clinical Decision Making Moderate    Rehab Potential Good    PT Frequency 2x / week    PT Duration 8 weeks    PT Treatment/Interventions ADLs/Self Care Home Management;Cryotherapy;Functional mobility training;Neuromuscular re-education;Therapeutic exercise;Therapeutic activities;Patient/family  education;Taping;Dry needling;Manual techniques;Passive range of motion    PT Next Visit Plan Continue POC    PT Home Exercise Plan AAROM using dowel to increase flex,abd,ER,IR    Consulted and Agree with Plan of Care Patient           Patient will benefit from skilled therapeutic intervention in order to improve the following deficits and impairments:  Decreased endurance,Decreased mobility,Decreased range of motion,Impaired UE functional use,Impaired flexibility,Decreased strength,Decreased activity tolerance  Visit Diagnosis: Chronic left shoulder pain  Stiffness of left shoulder, not elsewhere classified     Problem List Patient Active Problem List   Diagnosis Date Noted  . Breast cancer ([place]) 02/02/2017  . Environmental allergies 02/02/2017  . Menopausal syndrome 02/02/2017  . Chronic left hip pain 12/28/2016  . Left upper arm pain 12/28/2016  . Right foot pain 10/26/2016  . Greater trochanteric bursitis of left hip 08/03/2016  . Bilateral hand pain 04/27/2016  . Hyperlipemia   . Cognitive deficits   . Memory loss   . Lung nodule   . Malignant neoplasm of upper lobe of right lung [facility]) 05/27/2014     Durwin Reges DPT Turner Daniels, SPT  Durwin Reges 08/28/2020, 5:58 PM  [place] [facility] [address] [place], [address] Phone: [phone]   Fax:  [phone]  Name: Margaret Potts MRN: [id number] Date of Birth: 06-Sep-1955

## 2020-09-04 ENCOUNTER — Ambulatory Visit: Payer: Federal, State, Local not specified - PPO | Admitting: Occupational Therapy

## 2020-09-04 ENCOUNTER — Other Ambulatory Visit: Payer: Self-pay

## 2020-09-04 ENCOUNTER — Encounter: Payer: Self-pay | Admitting: Physical Therapy

## 2020-09-04 ENCOUNTER — Encounter: Payer: Self-pay | Admitting: Occupational Therapy

## 2020-09-04 ENCOUNTER — Ambulatory Visit: Payer: Federal, State, Local not specified - PPO | Admitting: Physical Therapy

## 2020-09-04 DIAGNOSIS — M25512 Pain in left shoulder: Secondary | ICD-10-CM | POA: Diagnosis not present

## 2020-09-04 DIAGNOSIS — M25532 Pain in left wrist: Secondary | ICD-10-CM

## 2020-09-04 DIAGNOSIS — M6281 Muscle weakness (generalized): Secondary | ICD-10-CM

## 2020-09-04 DIAGNOSIS — M654 Radial styloid tenosynovitis [de Quervain]: Secondary | ICD-10-CM

## 2020-09-04 DIAGNOSIS — G8929 Other chronic pain: Secondary | ICD-10-CM

## 2020-09-04 DIAGNOSIS — M25612 Stiffness of left shoulder, not elsewhere classified: Secondary | ICD-10-CM

## 2020-09-04 NOTE — Therapy (Signed)
[place] [facility] [address], [place], [address] Phone: [phone]   Fax:  [phone]  Occupational Therapy Evaluation  Patient Details  Name: Margaret Potts MRN: [id number] Date of Birth: 05-21-1956 Referring Provider (OT): Dr Candelaria Stagers   Encounter Date: 09/04/2020   OT End of Session - 09/04/20 0958    Visit Number 1    Number of Visits 10    Date for OT Re-Evaluation 10/16/20    OT Start Time 0822    OT Stop Time 0935    OT Time Calculation (min) 73 min    Activity Tolerance Patient tolerated treatment well    Behavior During Therapy [facility] for tasks assessed/performed           Past Medical History:  Diagnosis Date  . Arthritis   . Breast cancer ([place])    RT with mastectomy in 1995  . Cognitive deficits   . Depression   . Hyperlipemia   . Hypertension   . Lung cancer ([place])    RT upper lobe removed in OCT 2015  . Lung nodule   . Memory loss   . Seizures ([place])     Past Surgical History:  Procedure Laterality Date  . BREAST BIOPSY Left    benign  . BREAST BIOPSY Left 05/27/2017   [place] core ribbon REACTIVE FIBROSIS CONSISTENT WITH SCAR.   Marland Kitchen COLONOSCOPY WITH PROPOFOL N/A 02/09/2017   Procedure: COLONOSCOPY WITH PROPOFOL;  Surgeon: Jonathon Bellows, MD;  Location: [place];  Service: Gastroenterology;  Laterality: N/A;  . ESOPHAGOGASTRODUODENOSCOPY N/A 02/09/2017   Procedure: ESOPHAGOGASTRODUODENOSCOPY (EGD);  Surgeon: Jonathon Bellows, MD;  Location: [place];  Service: Gastroenterology;  Laterality: N/A;  . LUNG LOBECTOMY    . MASTECTOMY Right   . POLYPECTOMY N/A 02/09/2017   Procedure: POLYPECTOMY;  Surgeon: Jonathon Bellows, MD;  Location: [place];  Service: Gastroenterology;  Laterality: N/A;  . right mastectomy      There were no vitals filed for this visit.   Subjective Assessment - 09/04/20 0944    Subjective  Remember when you seen me for the R wrist , I was using my L  more and then this wrist started bothering me now over time and the shoulder    Pertinent History Pt refer to OT because of L wrist pain  -seen Dr Candelaria Stagers 07/23/20- and refer to PT for L shoulder pain -  possible ECU tendinitis -was wearing on and off prefab wrist splint    Patient Stated Goals Want my wrist better so I can grip and twist objects, push up , driving and playing golf without pain    Currently in Pain? Yes    Pain Score 9     Pain Location Wrist    Pain Orientation Left    Pain Descriptors / Indicators Aching;Tender;Shooting;Sharp    Pain Type Acute pain    Pain Onset More than a month ago    Pain Frequency Constant             [facility] OT Assessment - 09/04/20 0001      Assessment   Medical Diagnosis L 1st dorsal compartment and ECU tendinitis    Referring Provider (OT) Dr Candelaria Stagers    Onset Date/Surgical Date --   about year   Hand Dominance Right    Prior Therapy --   seen OT few yrs ago for R 1st dorsal compartment tenosynovitis     Precautions   Required Braces or  Orthoses --   wearing wrist splint - but will make thumb spica next visit     Home  Environment   Lives With Alone      Prior Function   Vocation Full time employment    Leisure post office working, play gold , travel , yard work      AROM   Right Forearm Supination 90 Degrees    Left Forearm Supination 90 Degrees   pain end range   Right Wrist Extension 70 Degrees    Right Wrist Flexion 80 Degrees    Right Wrist Radial Deviation 20 Degrees    Right Wrist Ulnar Deviation 28 Degrees    Left Wrist Extension 70 Degrees   pain radial wrist   Left Wrist Flexion 80 Degrees   pain ulnar wrist   Left Wrist Radial Deviation 20 Degrees   pain ulnar wrist   Left Wrist Ulnar Deviation 28 Degrees   pain ulnar wrist     Strength   Right Hand Grip (lbs) 35    Right Hand Lateral Pinch 10 lbs    Right Hand 3 Point Pinch 10 lbs    Left Hand Grip (lbs) 18    Left Hand Lateral Pinch 6 lbs    Left Hand 3 Point  Pinch 6 lbs      Left Hand AROM   L Thumb Radial ADduction/ABduction 0-55 --   WNL- but pain   L Thumb Opposition to Index --   opposition WNL - tightness                skin check done prior and afterwards- no issues    OT Treatments/Exercises (OP) - 09/04/20 0001      Iontophoresis   Type of Iontophoresis Dexamethasone    Location ECU , 1st dorsal compartment L    Dose med patch both , 1.6 current    Time 24      LUE Contrast Bath   Time 8 minutes    Comments wrist and hand prior to soft tissue          after contrast -done some soft tissue - webspace of hand and thumb and MC spreads graston tool nr 2 for sweeping volar and dorsal forearm and wrist prior  pt to wear wrist splint most all the time except ADL's and will fabricate custom thumb spica next time        OT Education - 09/04/20 0957    Education Details findings of eval and HEP    Person(s) Educated Patient    Methods Explanation;Demonstration;Tactile cues;Verbal cues    Comprehension Returned demonstration;Verbalized understanding            OT Short Term Goals - 09/04/20 1009      OT SHORT TERM GOAL #1   Title Pt to be independent in HEP to wear splint , do HEP and modify using her hand to decrease pain    Baseline L pain at wrist 7-9/10 - 60% compliance with prefab wrist splint    Time 3    Period Weeks    Status New    Target Date 09/25/20      OT SHORT TERM GOAL #2   Title Pain on PRWHE improve with more than 20 points    Baseline Eval pain on PRWHE 44/50 -10/10 - tenderness over L ulnar wrist, distal radius head 9/10 , positive [name] ,    Time 4    Period Weeks    Status New  Target Date 10/02/20             OT Long Term Goals - 09/04/20 1012      OT LONG TERM GOAL #1   Title Pain decrease for pt to wean out of splint more than 75% of time    Baseline pain 9/10 and pt need to wear thumb spica all the time -except ADL's    Time 5    Period Weeks    Status New     Target Date 10/09/20      OT LONG TERM GOAL #2   Title R wrist and thumb AROM improve to WNL without increase symptoms    Baseline L wrist and thumb RA pain increase to 9/10, end range with pushing up , gripping and twist , end range flexion extention    Time 6    Period Weeks    Status New    Target Date 10/16/20      OT LONG TERM GOAL #3   Title L grip and prehension strength increase with 10 and 2 lbs to without pain and increase symptoms    Baseline Grip R 35, L 18 lbs, lat and 3 point grip R 10 and L 6 lbs for both -pain limiting her    Time 6    Period Weeks    Status New    Target Date 10/16/20                 Plan - 09/04/20 1004    Clinical Impression Statement Pt present at OT eval with L 1st dorsal compartment tenosynovitis and ECU tendinitis symptoms - pt AROM WNL compare to R - but pain and tenderness over ulnar wrist, distal radius head , FInkelstein test - are 7-9/10 - decrease grip and prehension strength - pt was seen few yrs ago for R 1st dorsal compartment tenosynotivitis- Pt has prefab wrist splint and wearing it about 60% of time- need more compliance with splint wearing , initiated this date ionto with dexamethazone - will fabricate thumb spica next session    OT Occupational Profile and History Problem Focused Assessment - Including review of records relating to presenting problem    Occupational performance deficits (Please refer to evaluation for details): ADL's;IADL's;Work;Social Participation;Leisure;Play    Body Structure / Function / Physical Skills ADL;IADL;Pain;Strength;Flexibility;ROM;UE functional use;Edema    Rehab Potential Fair   depending on compliance with splint   Clinical Decision Making Limited treatment options, no task modification necessary    Comorbidities Affecting Occupational Performance: None    Modification or Assistance to Complete Evaluation  No modification of tasks or assist necessary to complete eval    OT Frequency 2x / week     OT Duration 6 weeks    OT Treatment/Interventions Self-care/ADL training;Contrast Bath;Fluidtherapy;Iontophoresis;Therapeutic exercise;Splinting;Patient/family education;Manual Therapy    Consulted and Agree with Plan of Care Patient           Patient will benefit from skilled therapeutic intervention in order to improve the following deficits and impairments:   Body Structure / Function / Physical Skills: ADL,IADL,Pain,Strength,Flexibility,ROM,UE functional use,Edema       Visit Diagnosis: Pain in left wrist - Plan: Ot plan of care cert/re-cert  Radial styloid tenosynovitis - Plan: Ot plan of care cert/re-cert  Muscle weakness (generalized) - Plan: Ot plan of care cert/re-cert    Problem List Patient Active Problem List   Diagnosis Date Noted  . Breast cancer ([place]) 02/02/2017  . Environmental allergies 02/02/2017  . Menopausal syndrome  02/02/2017  . Chronic left hip pain 12/28/2016  . Left upper arm pain 12/28/2016  . Right foot pain 10/26/2016  . Greater trochanteric bursitis of left hip 08/03/2016  . Bilateral hand pain 04/27/2016  . Hyperlipemia   . Cognitive deficits   . Memory loss   . Lung nodule   . Malignant neoplasm of upper lobe of right lung ([place]) 05/27/2014    Rosalyn Gess OTR/L,CLT 09/04/2020, 10:22 AM  [place] [facility] [address] [place], [address] Phone: [phone]   Fax:  [phone]  Name: Beulah Capobianco MRN: [id number] Date of Birth: 03-08-1956

## 2020-09-04 NOTE — Therapy (Signed)
[place] [address], [place], [address] Phone: [phone]   Fax:  [phone]  Physical Therapy Treatment  Patient Details  Name: Margaret Potts MRN: [id number] Date of Birth: 03-02-1956 No data recorded  Encounter Date: 09/04/2020   PT End of Session - 09/04/20 0955    Visit Number 9    Number of Visits 17    Date for PT Re-Evaluation 09/26/20    Authorization - Visit Number 9    Authorization - Number of Visits 17    PT Start Time 6962    PT Stop Time 1027    PT Time Calculation (min) 40 min    Activity Tolerance Patient tolerated treatment well;Patient limited by pain    Behavior During Therapy [facility] for tasks assessed/performed           Past Medical History:  Diagnosis Date  . Arthritis   . Breast cancer ([place])    RT with mastectomy in 1995  . Cognitive deficits   . Depression   . Hyperlipemia   . Hypertension   . Lung cancer ([place])    RT upper lobe removed in OCT 2015  . Lung nodule   . Memory loss   . Seizures ([place])     Past Surgical History:  Procedure Laterality Date  . BREAST BIOPSY Left    benign  . BREAST BIOPSY Left 05/27/2017   [place] core ribbon REACTIVE FIBROSIS CONSISTENT WITH SCAR.   Marland Kitchen COLONOSCOPY WITH PROPOFOL N/A 02/09/2017   Procedure: COLONOSCOPY WITH PROPOFOL;  Surgeon: Jonathon Bellows, MD;  Location: [place];  Service: Gastroenterology;  Laterality: N/A;  . ESOPHAGOGASTRODUODENOSCOPY N/A 02/09/2017   Procedure: ESOPHAGOGASTRODUODENOSCOPY (EGD);  Surgeon: Jonathon Bellows, MD;  Location: [place];  Service: Gastroenterology;  Laterality: N/A;  . LUNG LOBECTOMY    . MASTECTOMY Right   . POLYPECTOMY N/A 02/09/2017   Procedure: POLYPECTOMY;  Surgeon: Jonathon Bellows, MD;  Location: [place];  Service: Gastroenterology;  Laterality: N/A;  . right mastectomy      There were no vitals filed for this visit.   Subjective Assessment - 09/04/20 0947     Subjective Pt reports some minimal pain but overall feels it is moving much better coompared to start of PT. Continued compliance with HEP    Pertinent History Pt is 65 yo female with c/c left shoulder pain running primarily posterolaterally and left wrist pain on ulnar side. Pain began two months ago with insidious onset. Current pain is a 5/10 Worst 7/10 Best 4/10. Pain is aggrevated with raising overhead and reaching across body and with lifting. Able to ease pain with heat packs and biofreeze and doctor prescribed medicine. Pain is better in morning and worst through the night, unable to sleep on shoulder but is able to move out of position to go back to sleep. 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 Patient would like to be able to get back to full duty and golf without any pain (lifting 20# or more). History of R DeQuervians (seen by OT at this clinic) last year with L shoulder pain developing following; per patient possibly overuse from using L side more than R. Same pain in R wrist at this time, patient wears bilat braces during work day to ease pain. Denies N/V, fever, night sweats, unrelenting pain that awakens her from sleep, unexplained weight loss/gains, falls in past  47months.    Limitations Lifting    How long can you sit comfortably? unlimited    How long can you stand comfortably? unlimited    How long can you walk comfortably? unlimited    Diagnostic tests Xrays normal    Pain Onset More than a month ago           Therex Pulleys flex / abd for 5 min with cuing for scapulohumeral rhythm and cues to keep cervical in neutral and prevent shoulder hike   TRX overhead flexion walk 2x12 ; min cuing to maintain elbow ext and not to lean trunk backwards   TRX overhead abd walk 2x12; cuing to not rotate torse and prevent shoulder hike    Seated overhead press 3x6 3# DB; w/ towel roll at upper thoracic    Seated AAROM overhead flexion x  15; w/ towel roll at upper thoracic  Sidelying abd 1# DB 2x10; with slight overpressure into more abd    Manual  PROM all directions GHJ distraction  G3-4 post GHJ mob inf/post in 90d abd  G3-4 post GHJ mobs with movement into flex/abd/ER/IR STM to bicep ,UT, and pec minor.  Pt guarded mainly with ER                          PT Education - 09/04/20 0953    Education Details therex form/ technique    Person(s) Educated Patient    Methods Explanation;Demonstration;Verbal cues    Comprehension Verbalized understanding;Returned demonstration;Verbal cues required            PT Short Term Goals - 07/30/20 1535      PT SHORT TERM GOAL #1   Title Pt will demonstrate full PROM in order to demonstrate joint mobility needed for proper mechanics for overhead strength progression    Baseline Flex 143; Abd 90; ER 42; IR 12   07/30/20    Time 4    Period Weeks    Status New    Target Date 08/27/20      PT SHORT TERM GOAL #2   Title Pt will be independent with HEP in order to improve strength and ROM in order to decrease fall risk and improve function at home and work.    Baseline 07/30/20 HEP given    Time 8    Period Weeks    Status New             PT Long Term Goals - 07/30/20 1541      PT LONG TERM GOAL #1   Title Pt will decrease pain NPRS by 3 points with overhead movements for significant clinical reduction in pain.    Baseline 07/30/20 Worst 7/10    Time 8    Period Weeks    Status New      PT LONG TERM GOAL #2   Title Pt will demonstrate full shoulder AROM in order to complete overhead ADLs    Baseline 07/30/20 flex: 140 abd 90d ER 40d IR 10d    Time 8    Period Weeks    Status New      PT LONG TERM GOAL #3   Title Pt will demonstrate gross L RTC and periscapular strength of 4/5 in order to complete overhead lifting, pushing/pulling for job duties    Baseline 07/31/19 all RTC and periscapular strength 2+ d/t not able to obtain full AROM     Time 8    Period Weeks  Status New      PT LONG TERM GOAL #4   Title Patient will increase FOTO score to 66 to demonstrate predicted increase in functional mobility to complete ADLs    Baseline 07/31/19 46    Time 8    Period Weeks    Status New      PT LONG TERM GOAL #5   Title Pt will be able to lift 20# box on overhead shelf with pain not exceeding 2/10 in order to demonstrate decreased pain, and safe lifting needed for job    Baseline 07/31/19 unable    Time 8    Period Weeks                 Plan - 09/04/20 6384    Clinical Impression Statement PT continued therex progression for increased shoulder ROM and strength and periscapular strength for carry over into overhead functional movements. Pt performed all therex with proper technique following demonstration and cuing. Pt continues to demonstrate improved ROM and ability to perform overhead therex. Pt had some tolerable pain increases and continued good motivation throughout session. PT will continue to progress as able.    Personal Factors and Comorbidities Time since onset of injury/illness/exacerbation;Past/Current Experience;Profession;Age;Comorbidity 1;Comorbidity 2;Comorbidity 3+    Comorbidities history of cancer, HLD, HTN    Examination-Activity Limitations Lift;Reach Overhead    Examination-Participation Restrictions Occupation    Stability/Clinical Decision Making Evolving/Moderate complexity    Clinical Decision Making Moderate    Rehab Potential Good    PT Frequency 2x / week    PT Duration 8 weeks    PT Treatment/Interventions ADLs/Self Care Home Management;Cryotherapy;Functional mobility training;Neuromuscular re-education;Therapeutic exercise;Therapeutic activities;Patient/family education;Taping;Dry needling;Manual techniques;Passive range of motion    PT Next Visit Plan Continue POC    PT Home Exercise Plan AAROM using dowel to increase flex,abd,ER,IR    Consulted and Agree with Plan of Care Patient            Patient will benefit from skilled therapeutic intervention in order to improve the following deficits and impairments:  Decreased endurance,Decreased mobility,Decreased range of motion,Impaired UE functional use,Impaired flexibility,Decreased strength,Decreased activity tolerance  Visit Diagnosis: Chronic left shoulder pain  Stiffness of left shoulder, not elsewhere classified     Problem List Patient Active Problem List   Diagnosis Date Noted  . Breast cancer ([place]) 02/02/2017  . Environmental allergies 02/02/2017  . Menopausal syndrome 02/02/2017  . Chronic left hip pain 12/28/2016  . Left upper arm pain 12/28/2016  . Right foot pain 10/26/2016  . Greater trochanteric bursitis of left hip 08/03/2016  . Bilateral hand pain 04/27/2016  . Hyperlipemia   . Cognitive deficits   . Memory loss   . Lung nodule   . Malignant neoplasm of upper lobe of right lung [facility]) 05/27/2014    Durwin Reges DPT Turner Daniels, SPT   Durwin Reges 09/04/2020, 1:31 PM  [place] [facility] [address] [place], [address] Phone: [phone]   Fax:  [phone]  Name: Margaret Potts MRN: [id number] Date of Birth: [birth year]

## 2020-09-04 NOTE — Patient Instructions (Signed)
Contrast - 2 x day  Wear wrist splint most all the time

## 2020-09-09 ENCOUNTER — Encounter: Payer: Self-pay | Admitting: Physical Therapy

## 2020-09-09 ENCOUNTER — Other Ambulatory Visit: Payer: Self-pay

## 2020-09-09 ENCOUNTER — Ambulatory Visit: Payer: Federal, State, Local not specified - PPO | Attending: Sports Medicine | Admitting: Physical Therapy

## 2020-09-09 DIAGNOSIS — M25612 Stiffness of left shoulder, not elsewhere classified: Secondary | ICD-10-CM | POA: Diagnosis present

## 2020-09-09 DIAGNOSIS — M6281 Muscle weakness (generalized): Secondary | ICD-10-CM | POA: Insufficient documentation

## 2020-09-09 DIAGNOSIS — M654 Radial styloid tenosynovitis [de Quervain]: Secondary | ICD-10-CM | POA: Diagnosis present

## 2020-09-09 DIAGNOSIS — G8929 Other chronic pain: Secondary | ICD-10-CM | POA: Insufficient documentation

## 2020-09-09 DIAGNOSIS — M25532 Pain in left wrist: Secondary | ICD-10-CM | POA: Diagnosis present

## 2020-09-09 DIAGNOSIS — M25512 Pain in left shoulder: Secondary | ICD-10-CM | POA: Diagnosis not present

## 2020-09-09 NOTE — Therapy (Signed)
[facility] [address] [place], [address] Phone: [phone]   Fax:  [phone]  Physical Therapy Treatment/Progress Report 07/30/20 - 09/09/20  Patient Details  Name: Margaret Potts MRN: [id number] Date of Birth: 1956/03/01 No data recorded  Encounter Date: 09/09/2020   PT End of Session - 09/10/20 1037    Visit Number 10    Number of Visits 17    Date for PT Re-Evaluation 09/26/20    Authorization - Visit Number 10    Authorization - Number of Visits 17    PT Start Time 1600    PT Stop Time 1640    PT Time Calculation (min) 40 min    Activity Tolerance Patient tolerated treatment well;Patient limited by pain    Behavior During Therapy [facility] for tasks assessed/performed           Past Medical History:  Diagnosis Date  . Arthritis   . Breast cancer ([place])    RT with mastectomy in 1995  . Cognitive deficits   . Depression   . Hyperlipemia   . Hypertension   . Lung cancer ([place])    RT upper lobe removed in OCT 2015  . Lung nodule   . Memory loss   . Seizures ([place])     Past Surgical History:  Procedure Laterality Date  . BREAST BIOPSY Left    benign  . BREAST BIOPSY Left 05/27/2017   [place] core ribbon REACTIVE FIBROSIS CONSISTENT WITH SCAR.   Marland Kitchen COLONOSCOPY WITH PROPOFOL N/A 02/09/2017   Procedure: COLONOSCOPY WITH PROPOFOL;  Surgeon: Jonathon Bellows, MD;  Location: [place];  Service: Gastroenterology;  Laterality: N/A;  . ESOPHAGOGASTRODUODENOSCOPY N/A 02/09/2017   Procedure: ESOPHAGOGASTRODUODENOSCOPY (EGD);  Surgeon: Jonathon Bellows, MD;  Location: [place];  Service: Gastroenterology;  Laterality: N/A;  . LUNG LOBECTOMY    . MASTECTOMY Right   . POLYPECTOMY N/A 02/09/2017   Procedure: POLYPECTOMY;  Surgeon: Jonathon Bellows, MD;  Location: [place];  Service: Gastroenterology;  Laterality: N/A;  . right mastectomy      There were no vitals filed for this visit.   Subjective  Assessment - 09/09/20 1603    Subjective Pt reports some shoulder pain 4/10. Reports motion feels better.    Pertinent History Pt is 65 yo female with c/c left shoulder pain running primarily posterolaterally and left wrist pain on ulnar side. Pain began two months ago with insidious onset. Current pain is a 5/10 Worst 7/10 Best 4/10. Pain is aggrevated with raising overhead and reaching across body and with lifting. Able to ease pain with heat packs and biofreeze and doctor prescribed medicine. Pain is better in morning and worst through the night, unable to sleep on shoulder but is able to move out of position to go back to sleep. 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 Patient would like to be able to get back to full duty and golf without any pain (lifting 20# or more). History of R DeQuervians (seen by OT at this clinic) last year with L shoulder pain developing following; per patient possibly overuse from using L side more than R. Same pain in R wrist at this time, patient wears bilat braces during work day to ease pain. Denies N/V, fever, night sweats, unrelenting pain that awakens her from sleep, unexplained weight loss/gains, falls in past 25months.    Limitations Lifting  How long can you sit comfortably? unlimited    How long can you stand comfortably? unlimited    How long can you walk comfortably? unlimited    Diagnostic tests Xrays normal    Pain Onset More than a month ago           Therex Pulleys flex / abd for 5 min with cuing for scapulohumeral rhythm and cues to keep cervical in neutral and prevent shoulder hike  PT reassessed goals this session  Left shoulder A/PROM Flex 150/160 Abd 120/145 IR 70/75 ER 50/60   Overhead lifting to simulate work related duties; pt able to achieve 10#   Periscapular MMT left shoulder T/Y - 3-    PT reviewed the following HEP with patient with patient able to demonstrate a set of the  following with min cuing for correction needed. PT educated patient on parameters of therex; how/when to inc/decrease intensity, frequency, rep/set range, and purpose of therex with verbalized understanding from patient  Prone T (horiztonal abd) - 3x 10; 1x day; 2-3 d/wk  Overhead scaption GTB 3x 10; 1x day; 2-3 d/wk  Overhead press 2# DB 3x 10; 1x day; 2-3 d/wk  Sleeper stretch 3 x30 sec daily                        PT Education - 09/09/20 1606    Education Details therex form/technique    Person(s) Educated Patient    Methods Explanation;Demonstration;Verbal cues    Comprehension Verbalized understanding;Returned demonstration;Verbal cues required            PT Short Term Goals - 07/30/20 1535      PT SHORT TERM GOAL #1   Title Pt will demonstrate full PROM in order to demonstrate joint mobility needed for proper mechanics for overhead strength progression    Baseline Flex 143; Abd 90; ER 42; IR 12   07/30/20    Time 4    Period Weeks    Status New    Target Date 08/27/20      PT SHORT TERM GOAL #2   Title Pt will be independent with HEP in order to improve strength and ROM in order to decrease fall risk and improve function at home and work.    Baseline 07/30/20 HEP given    Time 8    Period Weeks    Status New             PT Long Term Goals - 09/09/20 1641      PT LONG TERM GOAL #1   Title Pt will decrease pain NPRS by 3 points with overhead movements for significant clinical reduction in pain.    Baseline 07/30/20 Worst 7/10; 09/09/20 5/10    Time 8    Period Weeks    Status On-going      PT LONG TERM GOAL #2   Title Pt will demonstrate full shoulder AROM in order to complete overhead ADLs    Baseline 07/30/20 flex: 140 abd 90d ER 40 IR 10; 09/09/20 flex 150 abd 120  ER 50  IR 70    Time 8    Period Weeks    Status On-going      PT LONG TERM GOAL #3   Title Pt will demonstrate gross L RTC and periscapular strength of 4/5 in order to  complete overhead lifting, pushing/pulling for job duties    Baseline 07/31/19 all RTC and periscapular strength 2+ d/t not able to obtain  full AROM; 09/09/20 3-    Time 8    Period Weeks    Status On-going      PT LONG TERM GOAL #4   Title Patient will increase FOTO score to 66 to demonstrate predicted increase in functional mobility to complete ADLs    Baseline 07/31/19 46; 09/09/20 64    Time 8    Period Weeks    Status On-going      PT LONG TERM GOAL #5   Title Pt will be able to lift 20# box on overhead shelf with pain not exceeding 2/10 in order to demonstrate decreased pain, and safe lifting needed for job    Baseline 07/31/19 unable; 09/09/20 10#    Time 8    Period Weeks    Status On-going                 Plan - 09/09/20 1747    Clinical Impression Statement PT reassesed goals this session where pt did not meet all goals but shows steady improvement toward reaching them. Pt demonstrated increases in AROM, UE strength, and FOTO. PT updated HEP recommendations to progress towards goals with patient returning verbalized understanding and demonstrations of HEP. PT educated patient on frequency/rep/set/intensity needed to continue progressing toward strength goals. Pt will continue to benefit from skilled PT 2x/week for 2 weeks to acheive remaining goals and ensure safety and independence with HEP without PT. PT will continue to progress as able.    Personal Factors and Comorbidities Time since onset of injury/illness/exacerbation;Past/Current Experience;Profession;Age;Comorbidity 1;Comorbidity 2;Comorbidity 3+    Comorbidities history of cancer, HLD, HTN    Examination-Activity Limitations Lift;Reach Overhead    Examination-Participation Restrictions Occupation    Stability/Clinical Decision Making Evolving/Moderate complexity    Clinical Decision Making Moderate    Rehab Potential Good    PT Frequency 2x / week    PT Duration 8 weeks    PT Treatment/Interventions ADLs/Self Care  Home Management;Cryotherapy;Functional mobility training;Neuromuscular re-education;Therapeutic exercise;Therapeutic activities;Patient/family education;Taping;Dry needling;Manual techniques;Passive range of motion    PT Next Visit Plan Continue POC    PT Home Exercise Plan AAROM using dowel to increase flex,abd,ER,IR    Consulted and Agree with Plan of Care Patient           Patient will benefit from skilled therapeutic intervention in order to improve the following deficits and impairments:  Decreased endurance,Decreased mobility,Decreased range of motion,Impaired UE functional use,Impaired flexibility,Decreased strength,Decreased activity tolerance  Visit Diagnosis: Chronic left shoulder pain  Stiffness of left shoulder, not elsewhere classified     Problem List Patient Active Problem List   Diagnosis Date Noted  . Breast cancer ([place]) 02/02/2017  . Environmental allergies 02/02/2017  . Menopausal syndrome 02/02/2017  . Chronic left hip pain 12/28/2016  . Left upper arm pain 12/28/2016  . Right foot pain 10/26/2016  . Greater trochanteric bursitis of left hip 08/03/2016  . Bilateral hand pain 04/27/2016  . Hyperlipemia   . Cognitive deficits   . Memory loss   . Lung nodule   . Malignant neoplasm of upper lobe of right lung [facility]) 05/27/2014    Durwin Reges DPT Turner Daniels, SPT  Durwin Reges 09/10/2020, 10:42 AM  [place] [address] [place], [address] Phone: [phone]   Fax:  [phone]  Name: Margaret Potts MRN: [id number] Date of Birth: September 14, 1955

## 2020-09-11 ENCOUNTER — Encounter: Payer: Federal, State, Local not specified - PPO | Admitting: Physical Therapy

## 2020-09-12 ENCOUNTER — Ambulatory Visit: Payer: Federal, State, Local not specified - PPO | Admitting: Physical Therapy

## 2020-09-16 ENCOUNTER — Other Ambulatory Visit: Payer: Self-pay

## 2020-09-16 ENCOUNTER — Ambulatory Visit: Payer: Federal, State, Local not specified - PPO | Admitting: Physical Therapy

## 2020-09-16 ENCOUNTER — Ambulatory Visit: Payer: Federal, State, Local not specified - PPO | Admitting: Occupational Therapy

## 2020-09-16 DIAGNOSIS — M25532 Pain in left wrist: Secondary | ICD-10-CM

## 2020-09-16 DIAGNOSIS — M654 Radial styloid tenosynovitis [de Quervain]: Secondary | ICD-10-CM

## 2020-09-16 DIAGNOSIS — M6281 Muscle weakness (generalized): Secondary | ICD-10-CM

## 2020-09-16 DIAGNOSIS — M25512 Pain in left shoulder: Secondary | ICD-10-CM | POA: Diagnosis not present

## 2020-09-16 NOTE — Therapy (Signed)
[place] [address], [place], [address] Phone: [phone]   Fax:  [phone]  Occupational Therapy Treatment  Patient Details  Name: Margaret Potts MRN: [id number] Date of Birth: 01-16-1956 Referring Provider (OT): Dr Candelaria Stagers   Encounter Date: 09/16/2020   OT End of Session - 09/16/20 1441    Visit Number 2    Number of Visits 10    Date for OT Re-Evaluation 10/16/20    OT Start Time 1442    OT Stop Time 1544    OT Time Calculation (min) 62 min    Activity Tolerance Patient tolerated treatment well    Behavior During Therapy [facility] for tasks assessed/performed           Past Medical History:  Diagnosis Date  . Arthritis   . Breast cancer ([place])    RT with mastectomy in 1995  . Cognitive deficits   . Depression   . Hyperlipemia   . Hypertension   . Lung cancer ([place])    RT upper lobe removed in OCT 2015  . Lung nodule   . Memory loss   . Seizures ([place])     Past Surgical History:  Procedure Laterality Date  . BREAST BIOPSY Left    benign  . BREAST BIOPSY Left 05/27/2017   [place] core ribbon REACTIVE FIBROSIS CONSISTENT WITH SCAR.   Marland Kitchen COLONOSCOPY WITH PROPOFOL N/A 02/09/2017   Procedure: COLONOSCOPY WITH PROPOFOL;  Surgeon: Jonathon Bellows, MD;  Location: [place];  Service: Gastroenterology;  Laterality: N/A;  . ESOPHAGOGASTRODUODENOSCOPY N/A 02/09/2017   Procedure: ESOPHAGOGASTRODUODENOSCOPY (EGD);  Surgeon: Jonathon Bellows, MD;  Location: [place];  Service: Gastroenterology;  Laterality: N/A;  . LUNG LOBECTOMY    . MASTECTOMY Right   . POLYPECTOMY N/A 02/09/2017   Procedure: POLYPECTOMY;  Surgeon: Jonathon Bellows, MD;  Location: [place];  Service: Gastroenterology;  Laterality: N/A;  . right mastectomy      There were no vitals filed for this visit.   Subjective Assessment - 09/16/20 1441    Subjective  My wrist is worse after I done some weights for my shoulder here  in the clinic - my wrist is not worse - shoulder getting better-    Pertinent History Pt refer to OT because of L wrist pain  -seen Dr Candelaria Stagers 07/23/20- and refer to PT for L shoulder pain -  possible ECU tendinitis -was wearing on and off prefab wrist splint    Patient Stated Goals Want my wrist better so I can grip and twist objects, push up , driving and playing golf without pain    Currently in Pain? Yes    Pain Score 8     Pain Location Wrist    Pain Orientation Left    Pain Descriptors / Indicators Aching;Tender;Shooting;Sharp    Pain Type Acute pain    Pain Onset More than a month ago    Pain Frequency Constant              [facility] OT Assessment - 09/16/20 0001      AROM   Left Wrist Extension 50 Degrees    Left Wrist Flexion 50 Degrees    Left Wrist Radial Deviation 20 Degrees    Left Wrist Ulnar Deviation 12 Degrees      Strength   Right Hand Grip (lbs) 35    Right Hand Lateral Pinch 10 lbs    Right Hand 3 Point Pinch 10 lbs  Left Hand Grip (lbs) 15    Left Hand Lateral Pinch 6 lbs    Left Hand 3 Point Pinch 4 lbs            Pt seen last time at eval - come in today to start treatment - pt report more pain this date on ulnar wrist, with decrease wrist AROM and increase pain  Decrease grip and prehension strength this date too - Tenderness over ECU , ulnar wrist -and distal radius and positive [name] - ulnar wrist worse         Skin check done prior -no issues - did not had any issues with eval - pt to keep medication patch on for hour   OT Treatments/Exercises (OP) - 09/16/20 0001      Iontophoresis   Type of Iontophoresis Dexamethasone    Location ECU , 1st dorsal compartment L    Dose med patch, 0.8 current and then able to increase to 1.8 current    Time 32      LUE Contrast Bath   Time 8 minutes    Comments wrist and hand to decrease pain           done some soft tissue - webspace of hand and thumb and MC spreads graston tool nr 2 for  sweeping volar and dorsal forearm prior to gentle AAROM for wrist and thumb pain free   pt to wear wrist splint most all the time except ADL's and will fabricate custom thumb spica next time if 1st dorsal compartment pain still issues          OT Education - 09/16/20 1441    Education Details progress and changes to HEP    Person(s) Educated Patient    Methods Explanation;Demonstration;Tactile cues;Verbal cues    Comprehension Returned demonstration;Verbalized understanding            OT Short Term Goals - 09/04/20 1009      OT SHORT TERM GOAL #1   Title Pt to be independent in HEP to wear splint , do HEP and modify using her hand to decrease pain    Baseline L pain at wrist 7-9/10 - 60% compliance with prefab wrist splint    Time 3    Period Weeks    Status New    Target Date 09/25/20      OT SHORT TERM GOAL #2   Title Pain on PRWHE improve with more than 20 points    Baseline Eval pain on PRWHE 44/50 -10/10 - tenderness over L ulnar wrist, distal radius head 9/10 , positive Finkelstein ,    Time 4    Period Weeks    Status New    Target Date 10/02/20             OT Long Term Goals - 09/04/20 1012      OT LONG TERM GOAL #1   Title Pain decrease for pt to wean out of splint more than 75% of time    Baseline pain 9/10 and pt need to wear thumb spica all the time -except ADL's    Time 5    Period Weeks    Status New    Target Date 10/09/20      OT LONG TERM GOAL #2   Title R wrist and thumb AROM improve to WNL without increase symptoms    Baseline L wrist and thumb RA pain increase to 9/10, end range with pushing up , gripping and twist , end  range flexion extention    Time 6    Period Weeks    Status New    Target Date 10/16/20      OT LONG TERM GOAL #3   Title L grip and prehension strength increase with 10 and 2 lbs to without pain and increase symptoms    Baseline Grip R 35, L 18 lbs, lat and 3 point grip R 10 and L 6 lbs for both -pain limiting her     Time 6    Period Weeks    Status New    Target Date 10/16/20                 Plan - 09/16/20 1442    Clinical Impression Statement Pt report since eval week ago - L wrist pain increase and she show decrease wrist AROM in all planes - increase pain with end range sup /pro this date too -with most of her pain on ulnar wrist -  decrease grip strength with increase pain - pt tender over distal radius head and Finkelstein positive - will assess next time if need to still fabricate custom thumb spica - pt to cont with wrist prefab -pt report he does help for pain - ionto with dexamethazone done over 1st dorsal compartment and ulnar wrist /ECU    OT Occupational Profile and History Problem Focused Assessment - Including review of records relating to presenting problem    Occupational performance deficits (Please refer to evaluation for details): ADL's;IADL's;Work;Social Participation;Leisure;Play    Body Structure / Function / Physical Skills ADL;IADL;Pain;Strength;Flexibility;ROM;UE functional use;Edema    Rehab Potential Fair    Clinical Decision Making Limited treatment options, no task modification necessary    Comorbidities Affecting Occupational Performance: None    Modification or Assistance to Complete Evaluation  No modification of tasks or assist necessary to complete eval    OT Frequency 2x / week    OT Duration 6 weeks    OT Treatment/Interventions Self-care/ADL training;Contrast Bath;Fluidtherapy;Iontophoresis;Therapeutic exercise;Splinting;Patient/family education;Manual Therapy    Consulted and Agree with Plan of Care Patient           Patient will benefit from skilled therapeutic intervention in order to improve the following deficits and impairments:   Body Structure / Function / Physical Skills: ADL,IADL,Pain,Strength,Flexibility,ROM,UE functional use,Edema       Visit Diagnosis: Pain in left wrist  Radial styloid tenosynovitis  Muscle weakness  (generalized)    Problem List Patient Active Problem List   Diagnosis Date Noted  . Breast cancer ([place]) 02/02/2017  . Environmental allergies 02/02/2017  . Menopausal syndrome 02/02/2017  . Chronic left hip pain 12/28/2016  . Left upper arm pain 12/28/2016  . Right foot pain 10/26/2016  . Greater trochanteric bursitis of left hip 08/03/2016  . Bilateral hand pain 04/27/2016  . Hyperlipemia   . Cognitive deficits   . Memory loss   . Lung nodule   . Malignant neoplasm of upper lobe of right lung ([place]) 05/27/2014    Rosalyn Gess OTR/L,CLT 09/16/2020, 4:16 PM  [facility] [address], [place], [address] Phone: [phone]   Fax:  [phone]  Name: Laurian Edrington MRN: [id number] Date of Birth: Jul 22, 1955

## 2020-09-18 ENCOUNTER — Other Ambulatory Visit: Payer: Self-pay

## 2020-09-18 ENCOUNTER — Encounter: Payer: Federal, State, Local not specified - PPO | Admitting: Physical Therapy

## 2020-09-18 ENCOUNTER — Ambulatory Visit: Payer: Federal, State, Local not specified - PPO | Admitting: Occupational Therapy

## 2020-09-18 DIAGNOSIS — M654 Radial styloid tenosynovitis [de Quervain]: Secondary | ICD-10-CM

## 2020-09-18 DIAGNOSIS — M25512 Pain in left shoulder: Secondary | ICD-10-CM | POA: Diagnosis not present

## 2020-09-18 DIAGNOSIS — M6281 Muscle weakness (generalized): Secondary | ICD-10-CM

## 2020-09-18 DIAGNOSIS — M25532 Pain in left wrist: Secondary | ICD-10-CM

## 2020-09-18 NOTE — Therapy (Signed)
[facility] [address], [place], [address] Phone: [phone]   Fax:  [phone]  Occupational Therapy Treatment  Patient Details  Name: Margaret Potts MRN: [id number] Date of Birth: 10/11/1955 Referring Provider (OT): Dr Candelaria Stagers   Encounter Date: 09/18/2020   OT End of Session - 09/18/20 1502    Visit Number 3    Number of Visits 10    Date for OT Re-Evaluation 10/16/20    OT Start Time 1415    OT Stop Time 1519    OT Time Calculation (min) 64 min    Activity Tolerance Patient tolerated treatment well    Behavior During Therapy [facility] for tasks assessed/performed           Past Medical History:  Diagnosis Date  . Arthritis   . Breast cancer ([place])    RT with mastectomy in 1995  . Cognitive deficits   . Depression   . Hyperlipemia   . Hypertension   . Lung cancer ([place])    RT upper lobe removed in OCT 2015  . Lung nodule   . Memory loss   . Seizures ([place])     Past Surgical History:  Procedure Laterality Date  . BREAST BIOPSY Left    benign  . BREAST BIOPSY Left 05/27/2017   [place] core ribbon REACTIVE FIBROSIS CONSISTENT WITH SCAR.   Marland Kitchen COLONOSCOPY WITH PROPOFOL N/A 02/09/2017   Procedure: COLONOSCOPY WITH PROPOFOL;  Surgeon: Jonathon Bellows, MD;  Location: [place];  Service: Gastroenterology;  Laterality: N/A;  . ESOPHAGOGASTRODUODENOSCOPY N/A 02/09/2017   Procedure: ESOPHAGOGASTRODUODENOSCOPY (EGD);  Surgeon: Jonathon Bellows, MD;  Location: [place];  Service: Gastroenterology;  Laterality: N/A;  . LUNG LOBECTOMY    . MASTECTOMY Right   . POLYPECTOMY N/A 02/09/2017   Procedure: POLYPECTOMY;  Surgeon: Jonathon Bellows, MD;  Location: [place];  Service: Gastroenterology;  Laterality: N/A;  . right mastectomy      There were no vitals filed for this visit.   Subjective Assessment - 09/18/20 1501    Subjective  My wrist on my pinkie side still hurting bad- It was bad last  week when I was doing weight for my shoulder and then my gripper - but don't do it now    Pertinent History Pt refer to OT because of L wrist pain  -seen Dr Candelaria Stagers 07/23/20- and refer to PT for L shoulder pain -  possible ECU tendinitis -was wearing on and off prefab wrist splint    Patient Stated Goals Want my wrist better so I can grip and twist objects, push up , driving and playing golf without pain    Currently in Pain? Yes    Pain Score 7     Pain Location Wrist    Pain Orientation Left    Pain Descriptors / Indicators Aching;Shooting;Tender    Pain Type Acute pain    Pain Onset More than a month ago    Pain Frequency Constant                 Tenderness over ECU , ulnar wrist -and distal radius and positive [name]    Skin check done prior -no issues l - pt to keep medication patch on for hour         OT Treatments/Exercises (OP) - 09/18/20 0001      Iontophoresis   Type of Iontophoresis Dexamethasone    Location ECU , 1st dorsal compartment L  Dose med patch, 1.2 current    Time 35              done  soft tissue - webspace of hand and thumb and MC spreads graston tool nr 2 for sweeping volar and dorsal forearm prior to gentle AAROM for wrist and thumb pain free  Fabricated thumb spica -custom over neoprene thumb and wrist wrap to wear most all the time - off for ADL's and HEP  pain free AROM for wrist , thumb PA and RA -as well  Opposition - after contrast           OT Education - 09/18/20 1502    Education Details splint wearing and HEP    Person(s) Educated Patient    Methods Explanation;Demonstration;Tactile cues;Verbal cues    Comprehension Returned demonstration;Verbalized understanding            OT Short Term Goals - 09/04/20 1009      OT SHORT TERM GOAL #1   Title Pt to be independent in HEP to wear splint , do HEP and modify using her hand to decrease pain    Baseline L pain at wrist 7-9/10 - 60% compliance with prefab  wrist splint    Time 3    Period Weeks    Status New    Target Date 09/25/20      OT SHORT TERM GOAL #2   Title Pain on PRWHE improve with more than 20 points    Baseline Eval pain on PRWHE 44/50 -10/10 - tenderness over L ulnar wrist, distal radius head 9/10 , positive Finkelstein ,    Time 4    Period Weeks    Status New    Target Date 10/02/20             OT Long Term Goals - 09/04/20 1012      OT LONG TERM GOAL #1   Title Pain decrease for pt to wean out of splint more than 75% of time    Baseline pain 9/10 and pt need to wear thumb spica all the time -except ADL's    Time 5    Period Weeks    Status New    Target Date 10/09/20      OT LONG TERM GOAL #2   Title R wrist and thumb AROM improve to WNL without increase symptoms    Baseline L wrist and thumb RA pain increase to 9/10, end range with pushing up , gripping and twist , end range flexion extention    Time 6    Period Weeks    Status New    Target Date 10/16/20      OT LONG TERM GOAL #3   Title L grip and prehension strength increase with 10 and 2 lbs to without pain and increase symptoms    Baseline Grip R 35, L 18 lbs, lat and 3 point grip R 10 and L 6 lbs for both -pain limiting her    Time 6    Period Weeks    Status New    Target Date 10/16/20                 Plan - 09/18/20 1503    Clinical Impression Statement Pt cont to have 7/10 pain over ECU - and tenderness over distal radius head and positive Tinel - this date fabricated custom thum spica for pt to wear most all the time except ADL's and HEP - 2nd session of ionto done  this date    OT Occupational Profile and History Problem Focused Assessment - Including review of records relating to presenting problem    Occupational performance deficits (Please refer to evaluation for details): ADL's;IADL's;Work;Social Participation;Leisure;Play    Body Structure / Function / Physical Skills ADL;IADL;Pain;Strength;Flexibility;ROM;UE functional  use;Edema    Rehab Potential Fair    Clinical Decision Making Limited treatment options, no task modification necessary    Comorbidities Affecting Occupational Performance: None    Modification or Assistance to Complete Evaluation  No modification of tasks or assist necessary to complete eval    OT Frequency 2x / week    OT Duration 6 weeks    OT Treatment/Interventions Self-care/ADL training;Contrast Bath;Fluidtherapy;Iontophoresis;Therapeutic exercise;Splinting;Patient/family education;Manual Therapy    Consulted and Agree with Plan of Care Patient           Patient will benefit from skilled therapeutic intervention in order to improve the following deficits and impairments:   Body Structure / Function / Physical Skills: ADL,IADL,Pain,Strength,Flexibility,ROM,UE functional use,Edema       Visit Diagnosis: Pain in left wrist  Radial styloid tenosynovitis  Muscle weakness (generalized)    Problem List Patient Active Problem List   Diagnosis Date Noted  . Breast cancer ([place]) 02/02/2017  . Environmental allergies 02/02/2017  . Menopausal syndrome 02/02/2017  . Chronic left hip pain 12/28/2016  . Left upper arm pain 12/28/2016  . Right foot pain 10/26/2016  . Greater trochanteric bursitis of left hip 08/03/2016  . Bilateral hand pain 04/27/2016  . Hyperlipemia   . Cognitive deficits   . Memory loss   . Lung nodule   . 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 [place], [address] Phone: [phone]   Fax:  [phone]  Name: Margaret Potts MRN: [id number] Date of Birth: October 16, 1955

## 2020-09-23 ENCOUNTER — Ambulatory Visit: Payer: Federal, State, Local not specified - PPO | Admitting: Occupational Therapy

## 2020-09-23 ENCOUNTER — Encounter: Payer: Federal, State, Local not specified - PPO | Admitting: Physical Therapy

## 2020-09-23 ENCOUNTER — Other Ambulatory Visit: Payer: Self-pay

## 2020-09-23 DIAGNOSIS — M654 Radial styloid tenosynovitis [de Quervain]: Secondary | ICD-10-CM

## 2020-09-23 DIAGNOSIS — M25512 Pain in left shoulder: Secondary | ICD-10-CM | POA: Diagnosis not present

## 2020-09-23 DIAGNOSIS — M6281 Muscle weakness (generalized): Secondary | ICD-10-CM

## 2020-09-23 DIAGNOSIS — M25532 Pain in left wrist: Secondary | ICD-10-CM

## 2020-09-23 NOTE — Therapy (Signed)
[facility] [address], [place], [address] Phone: [phone]   Fax:  [phone]  Occupational Therapy Treatment  Patient Details  Name: Margaret Potts MRN: [id number] Date of Birth: Nov 05, 1955 Referring Provider (OT): Dr Candelaria Stagers   Encounter Date: 09/23/2020   OT End of Session - 09/23/20 1542    Visit Number 4    Number of Visits 10    Date for OT Re-Evaluation 10/16/20    OT Start Time 1533    OT Stop Time 1644    OT Time Calculation (min) 71 min    Activity Tolerance Patient tolerated treatment well    Behavior During Therapy [facility] for tasks assessed/performed           Past Medical History:  Diagnosis Date  . Arthritis   . Breast cancer ([place])    RT with mastectomy in 1995  . Cognitive deficits   . Depression   . Hyperlipemia   . Hypertension   . Lung cancer ([place])    RT upper lobe removed in OCT 2015  . Lung nodule   . Memory loss   . Seizures ([place])     Past Surgical History:  Procedure Laterality Date  . BREAST BIOPSY Left    benign  . BREAST BIOPSY Left 05/27/2017   [place] core ribbon REACTIVE FIBROSIS CONSISTENT WITH SCAR.   Marland Kitchen COLONOSCOPY WITH PROPOFOL N/A 02/09/2017   Procedure: COLONOSCOPY WITH PROPOFOL;  Surgeon: Jonathon Bellows, MD;  Location: [facility];  Service: Gastroenterology;  Laterality: N/A;  . ESOPHAGOGASTRODUODENOSCOPY N/A 02/09/2017   Procedure: ESOPHAGOGASTRODUODENOSCOPY (EGD);  Surgeon: Jonathon Bellows, MD;  Location: [place];  Service: Gastroenterology;  Laterality: N/A;  . LUNG LOBECTOMY    . MASTECTOMY Right   . POLYPECTOMY N/A 02/09/2017   Procedure: POLYPECTOMY;  Surgeon: Jonathon Bellows, MD;  Location: [place];  Service: Gastroenterology;  Laterality: N/A;  . right mastectomy      There were no vitals filed for this visit.   Subjective Assessment - 09/23/20 1540    Subjective  The thumb splint was little tight around my wrist - tried to  stretch it out - think pain on the thumb little better    Pertinent History Pt refer to OT because of L wrist pain  -seen Dr Candelaria Stagers 07/23/20- and refer to PT for L shoulder pain -  possible ECU tendinitis -was wearing on and off prefab wrist splint    Patient Stated Goals Want my wrist better so I can grip and twist objects, push up , driving and playing golf without pain    Currently in Pain? Yes    Pain Score 5     Pain Location Wrist    Pain Orientation Left    Pain Descriptors / Indicators Aching;Tender;Shooting    Pain Onset More than a month ago    Pain Frequency Constant               tenderness over ulnar wrist and ECU decease to 5/10   pain more with wrist flexion than extention  Tenderness over 1st dorsal compartment decrease per pt         skin check done - pt was tender at first and forgot she put on biofreeze - pt washed off and still able to only tolerate this date 1.0 current - remove patch at end - pin size blister on the ulnar side of wrist   OT Treatments/Exercises (OP) - 09/23/20 0001  Iontophoresis   Type of Iontophoresis Dexamethasone    Location ECU , 1st dorsal compartment L    Dose med patch, 1.0 current    Time 38      LUE Contrast Bath   Time 8 minutes    Comments prior to soft tissue             done  soft tissue - webspace of hand and thumb and MC spreads graston tool nr 2 for sweeping volar and dorsal forearmprior to gentle AAROM for wrist and thumb pain free Modify thumb spica around forearm - was to tight per pt -custom over neoprene thumb and wrist wrap to wear most all the time - off for ADL's and HEP  Modify litlte wider and padding over ulnar styloid  pain free AROM for wrist , thumb PA and RA -as well  Opposition - after contrast to do at home         OT Education - 09/23/20 1542    Education Details splint wearing and HEP    Person(s) Educated Patient    Methods Explanation;Demonstration;Tactile cues;Verbal  cues    Comprehension Returned demonstration;Verbalized understanding            OT Short Term Goals - 09/04/20 1009      OT SHORT TERM GOAL #1   Title Pt to be independent in HEP to wear splint , do HEP and modify using her hand to decrease pain    Baseline L pain at wrist 7-9/10 - 60% compliance with prefab wrist splint    Time 3    Period Weeks    Status New    Target Date 09/25/20      OT SHORT TERM GOAL #2   Title Pain on PRWHE improve with more than 20 points    Baseline Eval pain on PRWHE 44/50 -10/10 - tenderness over L ulnar wrist, distal radius head 9/10 , positive Finkelstein ,    Time 4    Period Weeks    Status New    Target Date 10/02/20             OT Long Term Goals - 09/04/20 1012      OT LONG TERM GOAL #1   Title Pain decrease for pt to wean out of splint more than 75% of time    Baseline pain 9/10 and pt need to wear thumb spica all the time -except ADL's    Time 5    Period Weeks    Status New    Target Date 10/09/20      OT LONG TERM GOAL #2   Title R wrist and thumb AROM improve to WNL without increase symptoms    Baseline L wrist and thumb RA pain increase to 9/10, end range with pushing up , gripping and twist , end range flexion extention    Time 6    Period Weeks    Status New    Target Date 10/16/20      OT LONG TERM GOAL #3   Title L grip and prehension strength increase with 10 and 2 lbs to without pain and increase symptoms    Baseline Grip R 35, L 18 lbs, lat and 3 point grip R 10 and L 6 lbs for both -pain limiting her    Time 6    Period Weeks    Status New    Target Date 10/16/20  Plan - 09/23/20 1543    Clinical Impression Statement Pt's pain decrease over 1st dorsal compartment and tenderness over ECU too  -decrease to 5/10 over ulnar wrist  - thumb spica was to tight per pt - was able to adjust it to be looser around forearm - and padding over ulnar styloid - cont contrast , ionto and pain free AROM     OT Occupational Profile and History Problem Focused Assessment - Including review of records relating to presenting problem    Occupational performance deficits (Please refer to evaluation for details): ADL's;IADL's;Work;Social Participation;Leisure;Play    Body Structure / Function / Physical Skills ADL;IADL;Pain;Strength;Flexibility;ROM;UE functional use;Edema    Rehab Potential Fair    Clinical Decision Making Limited treatment options, no task modification necessary    Comorbidities Affecting Occupational Performance: None    Modification or Assistance to Complete Evaluation  No modification of tasks or assist necessary to complete eval    OT Frequency 2x / week    OT Duration 6 weeks    OT Treatment/Interventions Self-care/ADL training;Contrast Bath;Fluidtherapy;Iontophoresis;Therapeutic exercise;Splinting;Patient/family education;Manual Therapy    Consulted and Agree with Plan of Care Patient           Patient will benefit from skilled therapeutic intervention in order to improve the following deficits and impairments:   Body Structure / Function / Physical Skills: ADL,IADL,Pain,Strength,Flexibility,ROM,UE functional use,Edema       Visit Diagnosis: Pain in left wrist  Radial styloid tenosynovitis  Muscle weakness (generalized)    Problem List Patient Active Problem List   Diagnosis Date Noted  . Breast cancer ([place]) 02/02/2017  . Environmental allergies 02/02/2017  . Menopausal syndrome 02/02/2017  . Chronic left hip pain 12/28/2016  . Left upper arm pain 12/28/2016  . Right foot pain 10/26/2016  . Greater trochanteric bursitis of left hip 08/03/2016  . Bilateral hand pain 04/27/2016  . Hyperlipemia   . Cognitive deficits   . Memory loss   . Lung nodule   . Malignant neoplasm of upper lobe of right lung ([place]) 05/27/2014    Rosalyn Gess OTR/L,CLT 09/23/2020, 4:38 PM  [facility] [address] [place], [address] Phone: [phone]   Fax:  [phone]  Name: Margaret Potts MRN: [id number] Date of Birth: 1956-04-01

## 2020-09-25 ENCOUNTER — Ambulatory Visit: Payer: Federal, State, Local not specified - PPO | Admitting: Occupational Therapy

## 2020-09-25 ENCOUNTER — Encounter: Payer: Federal, State, Local not specified - PPO | Admitting: Physical Therapy

## 2020-09-25 ENCOUNTER — Other Ambulatory Visit: Payer: Self-pay

## 2020-09-25 DIAGNOSIS — M25512 Pain in left shoulder: Secondary | ICD-10-CM | POA: Diagnosis not present

## 2020-09-25 DIAGNOSIS — M25532 Pain in left wrist: Secondary | ICD-10-CM

## 2020-09-25 DIAGNOSIS — M654 Radial styloid tenosynovitis [de Quervain]: Secondary | ICD-10-CM

## 2020-09-25 DIAGNOSIS — M6281 Muscle weakness (generalized): Secondary | ICD-10-CM

## 2020-09-25 NOTE — Therapy (Signed)
[facility] [address] [place], [address] Phone: [phone]   Fax:  [phone]  Occupational Therapy Treatment  Patient Details  Name: Margaret Potts MRN: [id number] Date of Birth: July 06, 1956 Referring Provider (OT): Dr Candelaria Stagers   Encounter Date: 09/25/2020   OT End of Session - 09/25/20 0903    Visit Number 5    Number of Visits 10    Date for OT Re-Evaluation 10/16/20    OT Start Time 0816    Activity Tolerance Patient tolerated treatment well    Behavior During Therapy [facility] for tasks assessed/performed           Past Medical History:  Diagnosis Date  . Arthritis   . Breast cancer ([place])    RT with mastectomy in 1995  . Cognitive deficits   . Depression   . Hyperlipemia   . Hypertension   . Lung cancer ([place])    RT upper lobe removed in OCT 2015  . Lung nodule   . Memory loss   . Seizures ([place])     Past Surgical History:  Procedure Laterality Date  . BREAST BIOPSY Left    benign  . BREAST BIOPSY Left 05/27/2017   [place] core ribbon REACTIVE FIBROSIS CONSISTENT WITH SCAR.   Marland Kitchen COLONOSCOPY WITH PROPOFOL N/A 02/09/2017   Procedure: COLONOSCOPY WITH PROPOFOL;  Surgeon: Jonathon Bellows, MD;  Location: [place];  Service: Gastroenterology;  Laterality: N/A;  . ESOPHAGOGASTRODUODENOSCOPY N/A 02/09/2017   Procedure: ESOPHAGOGASTRODUODENOSCOPY (EGD);  Surgeon: Jonathon Bellows, MD;  Location: [place];  Service: Gastroenterology;  Laterality: N/A;  . LUNG LOBECTOMY    . MASTECTOMY Right   . POLYPECTOMY N/A 02/09/2017   Procedure: POLYPECTOMY;  Surgeon: Jonathon Bellows, MD;  Location: [place];  Service: Gastroenterology;  Laterality: N/A;  . right mastectomy      There were no vitals filed for this visit.   Subjective Assessment - 09/25/20 0823    Subjective  My thumb side better but the other side of wrist still hurting - can we change the splint to only wrist splint without the  thumb piece    Pertinent History Pt refer to OT because of L wrist pain  -seen Dr Candelaria Stagers 07/23/20- and refer to PT for L shoulder pain -  possible ECU tendinitis -was wearing on and off prefab wrist splint    Patient Stated Goals Want my wrist better so I can grip and twist objects, push up , driving and playing golf without pain    Currently in Pain? Yes    Pain Score 5    2008/11/16 ECU, 3/10 distal radius /Finkelstein   Pain Location Wrist    Pain Orientation Left    Pain Descriptors / Indicators Aching;Tender    Pain Type Acute pain    Pain Onset More than a month ago                  tenderness over ulnar wrist and ECU decease to 11-16-08   pain more with wrist flexion and extention  Tenderness  And [name] over 1st dorsal compartment decrease to 3/10  skin check done - pt tender and able to ramp up slowly - first 0.6 then increase to 0.8 and then 1.0 current - with ice pad - did cut slid on med patch on ECU to not irritate where tiny blister was last time - remove patch at end on ulnar side  - tolerate better but  still sensitive             OT Treatments/Exercises (OP) - 09/25/20 0001      Iontophoresis   Type of Iontophoresis Dexamethasone    Location ECU , 1st dorsal compartment L    Dose 0.6 increase to 0.8 and then 1.0 wiht ice pack -    Time 38      LUE Contrast Bath   Time 8 minutes    Comments prior to soft tissue           done soft tissue - webspace of hand and thumb and MC spreads graston tool nr 2 for sweeping volar and dorsal forearmprior to gentle AAROM for wrist and thumb pain free Cont to wear custom thumb spica over neoprene thumb and wrist wrap to wear most all the time - off for ADL's and HEP  Modify litlte wider and padding over ulnar styloid last time pain free AROM for wrist , thumb PA and RA -as well Opposition         OT Education - 09/25/20 0903    Education Details splint wearing and HEP    Person(s) Educated Patient     Methods Explanation;Demonstration;Tactile cues;Verbal cues    Comprehension Returned demonstration;Verbalized understanding            OT Short Term Goals - 09/04/20 1009      OT SHORT TERM GOAL #1   Title Pt to be independent in HEP to wear splint , do HEP and modify using her hand to decrease pain    Baseline L pain at wrist 7-9/10 - 60% compliance with prefab wrist splint    Time 3    Period Weeks    Status New    Target Date 09/25/20      OT SHORT TERM GOAL #2   Title Pain on PRWHE improve with more than 20 points    Baseline Eval pain on PRWHE 44/50 -10/10 - tenderness over L ulnar wrist, distal radius head 9/10 , positive Finkelstein ,    Time 4    Period Weeks    Status New    Target Date 10/02/20             OT Long Term Goals - 09/04/20 1012      OT LONG TERM GOAL #1   Title Pain decrease for pt to wean out of splint more than 75% of time    Baseline pain 9/10 and pt need to wear thumb spica all the time -except ADL's    Time 5    Period Weeks    Status New    Target Date 10/09/20      OT LONG TERM GOAL #2   Title R wrist and thumb AROM improve to WNL without increase symptoms    Baseline L wrist and thumb RA pain increase to 9/10, end range with pushing up , gripping and twist , end range flexion extention    Time 6    Period Weeks    Status New    Target Date 10/16/20      OT LONG TERM GOAL #3   Title L grip and prehension strength increase with 10 and 2 lbs to without pain and increase symptoms    Baseline Grip R 35, L 18 lbs, lat and 3 point grip R 10 and L 6 lbs for both -pain limiting her    Time 6    Period Weeks    Status New  Target Date 10/16/20                 Plan - 09/25/20 0903    Clinical Impression Statement Pt seen this date for 3rd Ionto on 1st dorsal compartment - improving - and then ECU - pain still 5-6/10 with wrist ext and flexion , tenderness over ECU - pt ed on why she needed still the thumb spica splint and not  only wrist splint- cont contrast , soft tissue and painfree AROM    OT Occupational Profile and History Problem Focused Assessment - Including review of records relating to presenting problem    Occupational performance deficits (Please refer to evaluation for details): ADL's;IADL's;Work;Social Participation;Leisure;Play    Body Structure / Function / Physical Skills ADL;IADL;Pain;Strength;Flexibility;ROM;UE functional use;Edema    Rehab Potential Fair    Clinical Decision Making Limited treatment options, no task modification necessary    Comorbidities Affecting Occupational Performance: None    Modification or Assistance to Complete Evaluation  No modification of tasks or assist necessary to complete eval    OT Frequency 2x / week    OT Duration 6 weeks    OT Treatment/Interventions Self-care/ADL training;Contrast Bath;Fluidtherapy;Iontophoresis;Therapeutic exercise;Splinting;Patient/family education;Manual Therapy    Consulted and Agree with Plan of Care Patient           Patient will benefit from skilled therapeutic intervention in order to improve the following deficits and impairments:   Body Structure / Function / Physical Skills: ADL,IADL,Pain,Strength,Flexibility,ROM,UE functional use,Edema       Visit Diagnosis: Pain in left wrist  Radial styloid tenosynovitis  Muscle weakness (generalized)    Problem List Patient Active Problem List   Diagnosis Date Noted  . Breast cancer ([place]) 02/02/2017  . Environmental allergies 02/02/2017  . Menopausal syndrome 02/02/2017  . Chronic left hip pain 12/28/2016  . Left upper arm pain 12/28/2016  . Right foot pain 10/26/2016  . Greater trochanteric bursitis of left hip 08/03/2016  . Bilateral hand pain 04/27/2016  . Hyperlipemia   . Cognitive deficits   . Memory loss   . Lung nodule   . Malignant neoplasm of upper lobe of right lung ([place]) 05/27/2014    Rosalyn Gess OTR/L,CLT 09/25/2020, 9:37 AM  [facility] [address] [place], [address] Phone: [phone]   Fax:  [phone]  Name: Margaret Potts MRN: [id number] Date of Birth: 05-01-1956

## 2020-09-25 NOTE — Patient Instructions (Addendum)
-   after contrast to do at home

## 2020-09-29 ENCOUNTER — Other Ambulatory Visit: Payer: Self-pay

## 2020-09-29 ENCOUNTER — Ambulatory Visit: Payer: Federal, State, Local not specified - PPO | Admitting: Occupational Therapy

## 2020-09-29 DIAGNOSIS — M25512 Pain in left shoulder: Secondary | ICD-10-CM | POA: Diagnosis not present

## 2020-09-29 DIAGNOSIS — M25532 Pain in left wrist: Secondary | ICD-10-CM

## 2020-09-29 DIAGNOSIS — M6281 Muscle weakness (generalized): Secondary | ICD-10-CM

## 2020-09-29 DIAGNOSIS — M654 Radial styloid tenosynovitis [de Quervain]: Secondary | ICD-10-CM

## 2020-09-29 NOTE — Therapy (Signed)
[facility] [address], [place], [address] Phone: [phone]   Fax:  [phone]  Occupational Therapy Treatment  Patient Details  Name: Margaret Potts MRN: [id number] Date of Birth: Oct 10, 1955 Referring Provider (OT): Dr Candelaria Stagers   Encounter Date: 09/29/2020   OT End of Session - 09/29/20 1750    Visit Number 6    Number of Visits 10    Date for OT Re-Evaluation 10/16/20    OT Start Time 1600    OT Stop Time 1711    OT Time Calculation (min) 71 min    Activity Tolerance Patient tolerated treatment well    Behavior During Therapy [facility] for tasks assessed/performed           Past Medical History:  Diagnosis Date  . Arthritis   . Breast cancer ([place])    RT with mastectomy in 1995  . Cognitive deficits   . Depression   . Hyperlipemia   . Hypertension   . Lung cancer ([place])    RT upper lobe removed in OCT 2015  . Lung nodule   . Memory loss   . Seizures ([place])     Past Surgical History:  Procedure Laterality Date  . BREAST BIOPSY Left    benign  . BREAST BIOPSY Left 05/27/2017   [place] core ribbon REACTIVE FIBROSIS CONSISTENT WITH SCAR.   Marland Kitchen COLONOSCOPY WITH PROPOFOL N/A 02/09/2017   Procedure: COLONOSCOPY WITH PROPOFOL;  Surgeon: Jonathon Bellows, MD;  Location: [place];  Service: Gastroenterology;  Laterality: N/A;  . ESOPHAGOGASTRODUODENOSCOPY N/A 02/09/2017   Procedure: ESOPHAGOGASTRODUODENOSCOPY (EGD);  Surgeon: Jonathon Bellows, MD;  Location: [place];  Service: Gastroenterology;  Laterality: N/A;  . LUNG LOBECTOMY    . MASTECTOMY Right   . POLYPECTOMY N/A 02/09/2017   Procedure: POLYPECTOMY;  Surgeon: Jonathon Bellows, MD;  Location: [place];  Service: Gastroenterology;  Laterality: N/A;  . right mastectomy      There were no vitals filed for this visit.   Subjective Assessment - 09/29/20 1747    Subjective  Do you think I can get shot for this wrist- my thumb side is  better -but the pinkie side still hurts when reaching , lifting , gripping- I wear the thumb splint about 50/50 with the wrist one- I want to play golf and use my hand at work    Pertinent History Pt refer to OT because of L wrist pain  -seen Dr Candelaria Stagers 07/23/20- and refer to PT for L shoulder pain -  possible ECU tendinitis -was wearing on and off prefab wrist splint    Patient Stated Goals Want my wrist better so I can grip and twist objects, push up , driving and playing golf without pain    Currently in Pain? Yes    Pain Score 5    ECU on L , 3/10 1st dorsal and Finkelstein   Pain Location Wrist    Pain Orientation Left    Pain Descriptors / Indicators Aching;Tender    Pain Type Acute pain    Pain Onset More than a month ago    Pain Frequency Intermittent             tenderness over ulnar wrist and ECU decease to 5/10  pain more with wrist flexion and extention , UD Tenderness  And Finkelstein over 1st dorsal compartment decrease to 3/10  skin check done - pt tender and able to ramp up slowly - first 0.6  then increase to 0.8 and then 1.0 current - with ice pad - did cut slid on med patch on ECU to not irritate where tiny blister was last time - remove patch at end on ulnar side  - tolerate better but still sensitive                     OT Treatments/Exercises (OP) - 09/29/20 0001      Iontophoresis   Type of Iontophoresis Dexamethasone    Dose 0.6 over ECU , 1.0 1st dorsal compartment    Time 36      LUE Contrast Bath   Time 8 minutes    Comments prior to soft tissue           done soft tissue - webspace of hand and thumb and MC spreads graston tool nr 2 for sweeping volar and dorsal forearmprior to gentle AAROM for wrist and thumb pain free Cont to wear custom thumb spica over neoprene thumb and wrist wrap to wear most all the time - off for ADL's and HEP Modify litlte wider and padding over ulnar styloidlast time pain free AROM for wrist ,  thumb PA and RA -as well Opposition         OT Education - 09/29/20 1750    Education Details splint wearing and HEP    Person(s) Educated Patient    Methods Explanation;Demonstration;Tactile cues;Verbal cues    Comprehension Returned demonstration;Verbalized understanding            OT Short Term Goals - 09/04/20 1009      OT SHORT TERM GOAL #1   Title Pt to be independent in HEP to wear splint , do HEP and modify using her hand to decrease pain    Baseline L pain at wrist 7-9/10 - 60% compliance with prefab wrist splint    Time 3    Period Weeks    Status New    Target Date 09/25/20      OT SHORT TERM GOAL #2   Title Pain on PRWHE improve with more than 20 points    Baseline Eval pain on PRWHE 44/50 -10/10 - tenderness over L ulnar wrist, distal radius head 9/10 , positive Finkelstein ,    Time 4    Period Weeks    Status New    Target Date 10/02/20             OT Long Term Goals - 09/04/20 1012      OT LONG TERM GOAL #1   Title Pain decrease for pt to wean out of splint more than 75% of time    Baseline pain 9/10 and pt need to wear thumb spica all the time -except ADL's    Time 5    Period Weeks    Status New    Target Date 10/09/20      OT LONG TERM GOAL #2   Title R wrist and thumb AROM improve to WNL without increase symptoms    Baseline L wrist and thumb RA pain increase to 9/10, end range with pushing up , gripping and twist , end range flexion extention    Time 6    Period Weeks    Status New    Target Date 10/16/20      OT LONG TERM GOAL #3   Title L grip and prehension strength increase with 10 and 2 lbs to without pain and increase symptoms    Baseline Grip R 35,  L 18 lbs, lat and 3 point grip R 10 and L 6 lbs for both -pain limiting her    Time 6    Period Weeks    Status New    Target Date 10/16/20                 Plan - 09/29/20 1751    Clinical Impression Statement Pt this date with 4th ionto session on L 1st dorsal  compartment- pain decrease to 3/10 and ECU 5/10 - was 9/10 - pt still tender and pain with wrist flexion , ext , UD - and grip - pt report alternate at work out of her thumb spica into wrist splint -50-50 - pt asked about possible shot at [employer] - send message to DR Candelaria Stagers to ask if option  -reinforce with pt that she still after shot need to be carefull with her work and golf - cannot just go back to her prior activities immediatly - pt to cont with HEP contrast, pain free AROM, splint wearing , soft tissue    OT Occupational Profile and History Problem Focused Assessment - Including review of records relating to presenting problem    Occupational performance deficits (Please refer to evaluation for details): ADL's;IADL's;Work;Social Participation;Leisure;Play    Body Structure / Function / Physical Skills ADL;IADL;Pain;Strength;Flexibility;ROM;UE functional use;Edema    Rehab Potential Fair    Clinical Decision Making Limited treatment options, no task modification necessary    Comorbidities Affecting Occupational Performance: None    Modification or Assistance to Complete Evaluation  No modification of tasks or assist necessary to complete eval    OT Frequency 2x / week    OT Duration 4 weeks    OT Treatment/Interventions Self-care/ADL training;Contrast Bath;Fluidtherapy;Iontophoresis;Therapeutic exercise;Splinting;Patient/family education;Manual Therapy    Consulted and Agree with Plan of Care Patient           Patient will benefit from skilled therapeutic intervention in order to improve the following deficits and impairments:   Body Structure / Function / Physical Skills: ADL,IADL,Pain,Strength,Flexibility,ROM,UE functional use,Edema       Visit Diagnosis: Pain in left wrist  Radial styloid tenosynovitis  Muscle weakness (generalized)    Problem List Patient Active Problem List   Diagnosis Date Noted  . Breast cancer ([place]) 02/02/2017  . Environmental allergies 02/02/2017  .  Menopausal syndrome 02/02/2017  . Chronic left hip pain 12/28/2016  . Left upper arm pain 12/28/2016  . Right foot pain 10/26/2016  . Greater trochanteric bursitis of left hip 08/03/2016  . Bilateral hand pain 04/27/2016  . Hyperlipemia   . Cognitive deficits   . Memory loss   . Lung nodule   . Malignant neoplasm of upper lobe of right lung ([place]) 05/27/2014    Rosalyn Gess OTR/L,CLT 09/29/2020, 5:55 PM  [facility] [address], [place], [address] Phone: [phone]   Fax:  [phone]  Name: Margaret Potts MRN: [id number] Date of Birth: Aug 19, 1955

## 2020-09-30 ENCOUNTER — Encounter: Payer: Federal, State, Local not specified - PPO | Admitting: Physical Therapy

## 2020-10-02 ENCOUNTER — Encounter: Payer: Federal, State, Local not specified - PPO | Admitting: Physical Therapy

## 2020-10-02 ENCOUNTER — Ambulatory Visit: Payer: Federal, State, Local not specified - PPO | Admitting: Occupational Therapy

## 2020-10-02 ENCOUNTER — Other Ambulatory Visit: Payer: Self-pay

## 2020-10-02 DIAGNOSIS — M25512 Pain in left shoulder: Secondary | ICD-10-CM | POA: Diagnosis not present

## 2020-10-02 DIAGNOSIS — M6281 Muscle weakness (generalized): Secondary | ICD-10-CM

## 2020-10-02 DIAGNOSIS — M25532 Pain in left wrist: Secondary | ICD-10-CM

## 2020-10-02 DIAGNOSIS — M654 Radial styloid tenosynovitis [de Quervain]: Secondary | ICD-10-CM

## 2020-10-02 NOTE — Therapy (Signed)
[facility] [address], [place], [address] Phone: [phone]   Fax:  [phone]  Occupational Therapy Treatment  Patient Details  Name: Margaret Potts MRN: [id number] Date of Birth: 04/08/1956 Referring Provider (OT): Dr Candelaria Stagers   Encounter Date: 10/02/2020   OT End of Session - 10/02/20 0828    Visit Number 7    Number of Visits 10    Date for OT Re-Evaluation 10/16/20    OT Start Time 0820    OT Stop Time 0918    OT Time Calculation (min) 58 min    Activity Tolerance Patient tolerated treatment well    Behavior During Therapy [facility] for tasks assessed/performed           Past Medical History:  Diagnosis Date  . Arthritis   . Breast cancer ([place])    RT with mastectomy in 1995  . Cognitive deficits   . Depression   . Hyperlipemia   . Hypertension   . Lung cancer ([place])    RT upper lobe removed in OCT 2015  . Lung nodule   . Memory loss   . Seizures ([place])     Past Surgical History:  Procedure Laterality Date  . BREAST BIOPSY Left    benign  . BREAST BIOPSY Left 05/27/2017   [place] core ribbon REACTIVE FIBROSIS CONSISTENT WITH SCAR.   Marland Kitchen COLONOSCOPY WITH PROPOFOL N/A 02/09/2017   Procedure: COLONOSCOPY WITH PROPOFOL;  Surgeon: Jonathon Bellows, MD;  Location: [place];  Service: Gastroenterology;  Laterality: N/A;  . ESOPHAGOGASTRODUODENOSCOPY N/A 02/09/2017   Procedure: ESOPHAGOGASTRODUODENOSCOPY (EGD);  Surgeon: Jonathon Bellows, MD;  Location: [place];  Service: Gastroenterology;  Laterality: N/A;  . LUNG LOBECTOMY    . MASTECTOMY Right   . POLYPECTOMY N/A 02/09/2017   Procedure: POLYPECTOMY;  Surgeon: Jonathon Bellows, MD;  Location: [place];  Service: Gastroenterology;  Laterality: N/A;  . right mastectomy      There were no vitals filed for this visit.   Subjective Assessment - 10/02/20 0819    Subjective  I want to maybe try some of the medication patch you do - and  hold off on the shot - pain feel little better on the side of the wrist - thumb doing better-  I wear wrist splint 40% and thumb splint 60%    Pertinent History Pt refer to OT because of L wrist pain  -seen Dr Candelaria Stagers 07/23/20- and refer to PT for L shoulder pain -  possible ECU tendinitis -was wearing on and off prefab wrist splint    Patient Stated Goals Want my wrist better so I can grip and twist objects, push up , driving and playing golf without pain    Currently in Pain? Yes    Pain Score 5    3 radial side of wrist   Pain Location Wrist    Pain Orientation Left    Pain Descriptors / Indicators Aching;Tender    Pain Type Acute pain    Pain Onset More than a month ago    Pain Frequency Intermittent    Aggravating Factors  wrist extention , flexion , UD , RD              [facility] OT Assessment - 10/02/20 0001      AROM   Left Wrist Extension 45 Degrees   pain   Left Wrist Flexion 65 Degrees   pain   Left Wrist Radial Deviation 20 Degrees  pain   Left Wrist Ulnar Deviation 15 Degrees   pain             tenderness over ulnar wrist and ECU decease to 5/10  pain more with wrist flexion and extention , UD Tenderness And Finkelstein over 1st dorsal compartment decrease to 2-3/10  skin check done - pt tender and able to ramp up slowly - this date tolerated over ECU 1.0 and over 1st dorsal compartment 0.8 current - tolerate better but still sensitive        OT Treatments/Exercises (OP) - 10/02/20 0001      Iontophoresis   Type of Iontophoresis Dexamethasone    Location ECU , 1st dorsal compartment L    Dose 1.0 ECU, 0.8 1st dorsal    Time 34      LUE Contrast Bath   Time 8 minutes    Comments prior to soft tissue and ROM                 done soft tissue - webspace of hand and thumb and MC spreads graston tool nr 2 for sweeping volar and dorsal forearmprior to gentle AAROM for wrist and thumb pain free Cont to wear custom thumb spica over  neoprene thumb and wrist wrap to wear most all the time - off for ADL's and HEP Contrast at home - and then pain free AROM for wrist , thumb PA and RA -as well Opposition           OT Education - 10/02/20 0828    Education Details splint wearing and HEP    Person(s) Educated Patient    Methods Explanation;Demonstration;Tactile cues;Verbal cues    Comprehension Returned demonstration;Verbalized understanding            OT Short Term Goals - 09/04/20 1009      OT SHORT TERM GOAL #1   Title Pt to be independent in HEP to wear splint , do HEP and modify using her hand to decrease pain    Baseline L pain at wrist 7-9/10 - 60% compliance with prefab wrist splint    Time 3    Period Weeks    Status New    Target Date 09/25/20      OT SHORT TERM GOAL #2   Title Pain on PRWHE improve with more than 20 points    Baseline Eval pain on PRWHE 44/50 -10/10 - tenderness over L ulnar wrist, distal radius head 9/10 , positive Finkelstein ,    Time 4    Period Weeks    Status New    Target Date 10/02/20             OT Long Term Goals - 09/04/20 1012      OT LONG TERM GOAL #1   Title Pain decrease for pt to wean out of splint more than 75% of time    Baseline pain 9/10 and pt need to wear thumb spica all the time -except ADL's    Time 5    Period Weeks    Status New    Target Date 10/09/20      OT LONG TERM GOAL #2   Title R wrist and thumb AROM improve to WNL without increase symptoms    Baseline L wrist and thumb RA pain increase to 9/10, end range with pushing up , gripping and twist , end range flexion extention    Time 6    Period Weeks    Status New  Target Date 10/16/20      OT LONG TERM GOAL #3   Title L grip and prehension strength increase with 10 and 2 lbs to without pain and increase symptoms    Baseline Grip R 35, L 18 lbs, lat and 3 point grip R 10 and L 6 lbs for both -pain limiting her    Time 6    Period Weeks    Status New    Target Date  10/16/20                 Plan - 10/02/20 0830    Clinical Impression Statement Pt this date with 5th session of ionto on L 1st dorsal and ECU - pain about 3-5/10 - as 9/10 - able to tolerate this date ionto better over ECU and pt report do not feel as painfull as last time - she did want to find out about shot - Dr Lenoria Chime said he can do it - but then pt said this date she wants to hold off on it - review again with pt importance to wear splints at the moment - wearing thumb spica 60% and wrist splint 40% - pt to wear thumb spica more - because of 1st dorsal compartment pain lingering    OT Occupational Profile and History Problem Focused Assessment - Including review of records relating to presenting problem    Occupational performance deficits (Please refer to evaluation for details): ADL's;IADL's;Work;Social Participation;Leisure;Play    Body Structure / Function / Physical Skills ADL;IADL;Pain;Strength;Flexibility;ROM;UE functional use;Edema    Rehab Potential Fair    Clinical Decision Making Limited treatment options, no task modification necessary    Comorbidities Affecting Occupational Performance: None    Modification or Assistance to Complete Evaluation  No modification of tasks or assist necessary to complete eval    OT Frequency 2x / week    OT Duration 4 weeks    OT Treatment/Interventions Self-care/ADL training;Contrast Bath;Fluidtherapy;Iontophoresis;Therapeutic exercise;Splinting;Patient/family education;Manual Therapy    Consulted and Agree with Plan of Care Patient           Patient will benefit from skilled therapeutic intervention in order to improve the following deficits and impairments:   Body Structure / Function / Physical Skills: ADL,IADL,Pain,Strength,Flexibility,ROM,UE functional use,Edema       Visit Diagnosis: Pain in left wrist  Radial styloid tenosynovitis  Muscle weakness (generalized)    Problem List Patient Active Problem List    Diagnosis Date Noted  . Breast cancer ([place]) 02/02/2017  . Environmental allergies 02/02/2017  . Menopausal syndrome 02/02/2017  . Chronic left hip pain 12/28/2016  . Left upper arm pain 12/28/2016  . Right foot pain 10/26/2016  . Greater trochanteric bursitis of left hip 08/03/2016  . Bilateral hand pain 04/27/2016  . Hyperlipemia   . Cognitive deficits   . Memory loss   . Lung nodule   . Malignant neoplasm of upper lobe of right lung ([place]) 05/27/2014    Rosalyn Gess OTR/L,CLT 10/02/2020, 9:46 AM  [facility] [address] [place], [address] Phone: [phone]   Fax:  [phone]  Name: Jadda Hunsucker MRN: [id number] Date of Birth: 09/08/1955

## 2020-10-03 ENCOUNTER — Encounter: Payer: Federal, State, Local not specified - PPO | Admitting: Occupational Therapy

## 2020-10-06 ENCOUNTER — Encounter: Payer: Federal, State, Local not specified - PPO | Admitting: Physical Therapy

## 2020-10-07 ENCOUNTER — Ambulatory Visit: Payer: Federal, State, Local not specified - PPO | Admitting: Occupational Therapy

## 2020-10-07 ENCOUNTER — Other Ambulatory Visit: Payer: Self-pay

## 2020-10-07 DIAGNOSIS — M25512 Pain in left shoulder: Secondary | ICD-10-CM | POA: Diagnosis not present

## 2020-10-07 DIAGNOSIS — M654 Radial styloid tenosynovitis [de Quervain]: Secondary | ICD-10-CM

## 2020-10-07 DIAGNOSIS — M25532 Pain in left wrist: Secondary | ICD-10-CM

## 2020-10-07 DIAGNOSIS — M6281 Muscle weakness (generalized): Secondary | ICD-10-CM

## 2020-10-07 NOTE — Therapy (Signed)
[facility] [address] [place], [address] Phone: [phone]   Fax:  [phone]  Occupational Therapy Treatment  Patient Details  Name: Margaret Potts MRN: [id number] Date of Birth: 1956/03/27 Referring Provider (OT): Dr Candelaria Stagers   Encounter Date: 10/07/2020   OT End of Session - 10/07/20 1434    Visit Number 8    Number of Visits 10    Date for OT Re-Evaluation 10/16/20    OT Start Time 1740    OT Stop Time 1504    OT Time Calculation (min) 67 min    Activity Tolerance Patient tolerated treatment well    Behavior During Therapy [facility] for tasks assessed/performed           Past Medical History:  Diagnosis Date  . Arthritis   . Breast cancer ([place])    RT with mastectomy in 1995  . Cognitive deficits   . Depression   . Hyperlipemia   . Hypertension   . Lung cancer ([place])    RT upper lobe removed in OCT 2015  . Lung nodule   . Memory loss   . Seizures ([place])     Past Surgical History:  Procedure Laterality Date  . BREAST BIOPSY Left    benign  . BREAST BIOPSY Left 05/27/2017   [place] core ribbon REACTIVE FIBROSIS CONSISTENT WITH SCAR.   Marland Kitchen COLONOSCOPY WITH PROPOFOL N/A 02/09/2017   Procedure: COLONOSCOPY WITH PROPOFOL;  Surgeon: Jonathon Bellows, MD;  Location: [place];  Service: Gastroenterology;  Laterality: N/A;  . ESOPHAGOGASTRODUODENOSCOPY N/A 02/09/2017   Procedure: ESOPHAGOGASTRODUODENOSCOPY (EGD);  Surgeon: Jonathon Bellows, MD;  Location: [place];  Service: Gastroenterology;  Laterality: N/A;  . LUNG LOBECTOMY    . MASTECTOMY Right   . POLYPECTOMY N/A 02/09/2017   Procedure: POLYPECTOMY;  Surgeon: Jonathon Bellows, MD;  Location: [place];  Service: Gastroenterology;  Laterality: N/A;  . right mastectomy      There were no vitals filed for this visit.   Subjective Assessment - 10/07/20 1410    Subjective  Done the splints but wrist splint about 60% and 40% thumb splint  and depending what I do - certain motion still bothering me more- but it is better than it was    Pertinent History Pt refer to OT because of L wrist pain  -seen Dr Candelaria Stagers 07/23/20- and refer to PT for L shoulder pain -  possible ECU tendinitis -was wearing on and off prefab wrist splint    Patient Stated Goals Want my wrist better so I can grip and twist objects, push up , driving and playing golf without pain    Currently in Pain? Yes    Pain Score 5    3 over dorsal compartment   Pain Location Wrist    Pain Orientation Left    Pain Descriptors / Indicators Aching;Tender    Pain Type Acute pain    Pain Onset More than a month ago    Pain Frequency Intermittent              [facility] OT Assessment - 10/07/20 0001      AROM   Left Wrist Extension 70 Degrees   pain   Left Wrist Flexion 80 Degrees   pain   Left Wrist Radial Deviation 20 Degrees   pain   Left Wrist Ulnar Deviation 25 Degrees   pain            tenderness over ulnar  wrist and ECU deceasing greatly - but still pull of  5/10 ulnar wrist and dorsal with flexion and extention - but great progress in AROM for wrist in all planes-   Tenderness And [name] over 1st dorsal compartment decrease to 2-3/10  skin check done -  able to ramp up slowly - this date tolerated over1st dorsal compartment 1.6 current - but not as much on ECU- tolerate better but still sensitive             OT Treatments/Exercises (OP) - 10/07/20 0001      Iontophoresis   Type of Iontophoresis Dexamethasone    Location ECU , 1st dorsal compartment L    Dose 0.7 ECU, 1.6 1st dorsal    Time 24      LUE Contrast Bath   Time 8 minutes    Comments prior to soft tissue and ROM               done soft tissue - webspace of hand and thumb and MC spreads graston tool nr 2 for sweeping volar and dorsal forearmprior to gentle AAROM for wrist and thumb pain free Cont to wear custom thumb spica over neoprene thumb and  wrist wrap to wear most all the time - off for ADL's and HEP Contrast at home - and then pain free AROM for wrist , thumb PA and RA -as well Opposition            OT Education - 10/07/20 1433    Education Details progress and splint wearing / HEP    Person(s) Educated Patient    Methods Explanation;Demonstration;Tactile cues;Verbal cues    Comprehension Returned demonstration;Verbalized understanding            OT Short Term Goals - 09/04/20 1009      OT SHORT TERM GOAL #1   Title Pt to be independent in HEP to wear splint , do HEP and modify using her hand to decrease pain    Baseline L pain at wrist 7-9/10 - 60% compliance with prefab wrist splint    Time 3    Period Weeks    Status New    Target Date 09/25/20      OT SHORT TERM GOAL #2   Title Pain on PRWHE improve with more than 20 points    Baseline Eval pain on PRWHE 44/50 -10/10 - tenderness over L ulnar wrist, distal radius head 9/10 , positive Finkelstein ,    Time 4    Period Weeks    Status New    Target Date 10/02/20             OT Long Term Goals - 09/04/20 1012      OT LONG TERM GOAL #1   Title Pain decrease for pt to wean out of splint more than 75% of time    Baseline pain 9/10 and pt need to wear thumb spica all the time -except ADL's    Time 5    Period Weeks    Status New    Target Date 10/09/20      OT LONG TERM GOAL #2   Title R wrist and thumb AROM improve to WNL without increase symptoms    Baseline L wrist and thumb RA pain increase to 9/10, end range with pushing up , gripping and twist , end range flexion extention    Time 6    Period Weeks    Status New    Target Date 10/16/20  OT LONG TERM GOAL #3   Title L grip and prehension strength increase with 10 and 2 lbs to without pain and increase symptoms    Baseline Grip R 35, L 18 lbs, lat and 3 point grip R 10 and L 6 lbs for both -pain limiting her    Time 6    Period Weeks    Status New    Target Date 10/16/20                  Plan - 10/07/20 1434    Clinical Impression Statement Pt this date with 7th session of ionto on L 1st dorsal compartment and ECU - pt pain decrease to 2-3/10 over radial side and 4-5/10 ulnar side/ECU - pt this date show great progress in wrist AROM in all planes with pain about same as last week -reinforce with pt again splint wearing and focus on cont to decrease pain this week    OT Occupational Profile and History Problem Focused Assessment - Including review of records relating to presenting problem    Occupational performance deficits (Please refer to evaluation for details): ADL's;IADL's;Work;Social Participation;Leisure;Play    Body Structure / Function / Physical Skills ADL;IADL;Pain;Strength;Flexibility;ROM;UE functional use;Edema    Rehab Potential Fair    Clinical Decision Making Limited treatment options, no task modification necessary    Comorbidities Affecting Occupational Performance: None    Modification or Assistance to Complete Evaluation  No modification of tasks or assist necessary to complete eval    OT Frequency 2x / week    OT Duration 4 weeks    OT Treatment/Interventions Self-care/ADL training;Contrast Bath;Fluidtherapy;Iontophoresis;Therapeutic exercise;Splinting;Patient/family education;Manual Therapy    Consulted and Agree with Plan of Care Patient           Patient will benefit from skilled therapeutic intervention in order to improve the following deficits and impairments:   Body Structure / Function / Physical Skills: ADL,IADL,Pain,Strength,Flexibility,ROM,UE functional use,Edema       Visit Diagnosis: Pain in left wrist  Radial styloid tenosynovitis  Muscle weakness (generalized)    Problem List Patient Active Problem List   Diagnosis Date Noted  . Breast cancer ([place]) 02/02/2017  . Environmental allergies 02/02/2017  . Menopausal syndrome 02/02/2017  . Chronic left hip pain 12/28/2016  . Left upper arm pain 12/28/2016   . Right foot pain 10/26/2016  . Greater trochanteric bursitis of left hip 08/03/2016  . Bilateral hand pain 04/27/2016  . Hyperlipemia   . Cognitive deficits   . Memory loss   . Lung nodule   . Malignant neoplasm of upper lobe of right lung ([place]) 05/27/2014    Rosalyn Gess OTR/L,CLT 10/07/2020, 3:06 PM  [facility] [address], [place], [address] Phone: [phone]   Fax:  [phone]  Name: Margaret Potts MRN: [id number] Date of Birth: [birth year]

## 2020-10-09 ENCOUNTER — Ambulatory Visit: Payer: Federal, State, Local not specified - PPO | Admitting: Occupational Therapy

## 2020-10-09 ENCOUNTER — Encounter: Payer: Federal, State, Local not specified - PPO | Admitting: Physical Therapy

## 2020-10-09 ENCOUNTER — Other Ambulatory Visit: Payer: Self-pay

## 2020-10-09 DIAGNOSIS — M6281 Muscle weakness (generalized): Secondary | ICD-10-CM

## 2020-10-09 DIAGNOSIS — M25512 Pain in left shoulder: Secondary | ICD-10-CM | POA: Diagnosis not present

## 2020-10-09 DIAGNOSIS — M25532 Pain in left wrist: Secondary | ICD-10-CM

## 2020-10-09 DIAGNOSIS — M654 Radial styloid tenosynovitis [de Quervain]: Secondary | ICD-10-CM

## 2020-10-09 NOTE — Therapy (Signed)
[facility] [address] [place], [address] Phone: [phone]   Fax:  [phone]  Occupational Therapy Treatment  Patient Details  Name: Margaret Potts MRN: [id number] Date of Birth: [birth year] Referring Provider (OT): Dr Candelaria Stagers   Encounter Date: 10/09/2020   OT End of Session - 10/09/20 0958    Visit Number 9    Number of Visits 10    Date for OT Re-Evaluation 10/16/20    OT Start Time 0946    OT Stop Time 1046    OT Time Calculation (min) 60 min    Activity Tolerance Patient tolerated treatment well    Behavior During Therapy [facility] for tasks assessed/performed           Past Medical History:  Diagnosis Date  . Arthritis   . Breast cancer ([place])    RT with mastectomy in 1995  . Cognitive deficits   . Depression   . Hyperlipemia   . Hypertension   . Lung cancer ([place])    RT upper lobe removed in OCT 2015  . Lung nodule   . Memory loss   . Seizures ([place])     Past Surgical History:  Procedure Laterality Date  . BREAST BIOPSY Left    benign  . BREAST BIOPSY Left 05/27/2017   [place] core ribbon REACTIVE FIBROSIS CONSISTENT WITH SCAR.   Marland Kitchen COLONOSCOPY WITH PROPOFOL N/A 02/09/2017   Procedure: COLONOSCOPY WITH PROPOFOL;  Surgeon: Jonathon Bellows, MD;  Location: [place];  Service: Gastroenterology;  Laterality: N/A;  . ESOPHAGOGASTRODUODENOSCOPY N/A 02/09/2017   Procedure: ESOPHAGOGASTRODUODENOSCOPY (EGD);  Surgeon: Jonathon Bellows, MD;  Location: [place];  Service: Gastroenterology;  Laterality: N/A;  . LUNG LOBECTOMY    . MASTECTOMY Right   . POLYPECTOMY N/A 02/09/2017   Procedure: POLYPECTOMY;  Surgeon: Jonathon Bellows, MD;  Location: [place];  Service: Gastroenterology;  Laterality: N/A;  . right mastectomy      There were no vitals filed for this visit.   Subjective Assessment - 10/09/20 0953    Subjective  About    Pertinent History Pt refer to OT because of L wrist  pain  -seen Dr Candelaria Stagers 07/23/20- and refer to PT for L shoulder pain -  possible ECU tendinitis -was wearing on and off prefab wrist splint    Patient Stated Goals Want my wrist better so I can grip and twist objects, push up , driving and playing golf without pain    Currently in Pain? Yes    Pain Score 5    3 1st dorsal compartment   Pain Location Wrist    Pain Orientation Left    Pain Descriptors / Indicators Aching;Tender    Pain Type Acute pain    Pain Onset More than a month ago    Pain Frequency Intermittent           tenderness over ulnar wrist and ECU deceasing greatly - but still pull to about  5/10  Dorsal proximal MC and dorsal wrist- tender around TFCC- ulnar wrist distal to ulnar styloid  great progress in AROM for wrist in all planes- since last time   reinforce again splint wearing  Tenderness And [name] over 1st dorsal compartment 2-3/10  skin check done -  able to ramp up slowly - this date tolerated over1st dorsal compartment 1.5 current - but not as much on ECU- tolerate better but still sensitive  OT Treatments/Exercises (OP) - 10/09/20 0001      Iontophoresis   Type of Iontophoresis Dexamethasone    Location ECU , 1st dorsal compartment L    Dose 0.7 ECU, 1.5 1st dorsal    Time 29      LUE Contrast Bath   Time 8 minutes    Comments prior to soft tissue and ROM             done soft tissue - webspace of hand and thumb and MC spreads graston tool nr 2 for sweeping volar and dorsal forearmprior to gentle AAROM for wrist and thumb pain free Cont to wear custom thumb spica over neoprene thumb and wrist wrap to wear most all the time - off for ADL's and HEP Contrast at home - and then pain free AROM for wrist , thumb PA and RA -as well Opposition           OT Education - 10/09/20 0958    Education Details progress and splint wearing / HEP    Person(s) Educated Patient     Comprehension Returned demonstration;Verbalized understanding            OT Short Term Goals - 09/04/20 1009      OT SHORT TERM GOAL #1   Title Pt to be independent in HEP to wear splint , do HEP and modify using her hand to decrease pain    Baseline L pain at wrist 7-9/10 - 60% compliance with prefab wrist splint    Time 3    Period Weeks    Status New    Target Date 09/25/20      OT SHORT TERM GOAL #2   Title Pain on PRWHE improve with more than 20 points    Baseline Eval pain on PRWHE 44/50 -10/10 - tenderness over L ulnar wrist, distal radius head 9/10 , positive Finkelstein ,    Time 4    Period Weeks    Status New    Target Date 10/02/20             OT Long Term Goals - 09/04/20 1012      OT LONG TERM GOAL #1   Title Pain decrease for pt to wean out of splint more than 75% of time    Baseline pain 9/10 and pt need to wear thumb spica all the time -except ADL's    Time 5    Period Weeks    Status New    Target Date 10/09/20      OT LONG TERM GOAL #2   Title R wrist and thumb AROM improve to WNL without increase symptoms    Baseline L wrist and thumb RA pain increase to 9/10, end range with pushing up , gripping and twist , end range flexion extention    Time 6    Period Weeks    Status New    Target Date 10/16/20      OT LONG TERM GOAL #3   Title L grip and prehension strength increase with 10 and 2 lbs to without pain and increase symptoms    Baseline Grip R 35, L 18 lbs, lat and 3 point grip R 10 and L 6 lbs for both -pain limiting her    Time 6    Period Weeks    Status New    Target Date 10/16/20                 Plan - 10/09/20 4098  Clinical Impression Statement Pt this date 8th session of ionto on L 1st dorsal compartment and ECU - pain still about 2-3 /10 over 1st dorsal compartment tenderness -and ECU lateral and distal to ulnar styloid - around TFCC - 5/10 - pain with wrist ext and flexion more over proximal MC's cont - reinforce to  wear splint if doing her shoulder stretches and most of the time should be on - thumb spica recommended    OT Occupational Profile and History Problem Focused Assessment - Including review of records relating to presenting problem    Occupational performance deficits (Please refer to evaluation for details): ADL's;IADL's;Work;Social Participation;Leisure;Play    Body Structure / Function / Physical Skills ADL;IADL;Pain;Strength;Flexibility;ROM;UE functional use;Edema    Rehab Potential Fair    Clinical Decision Making Limited treatment options, no task modification necessary    Comorbidities Affecting Occupational Performance: None    Modification or Assistance to Complete Evaluation  No modification of tasks or assist necessary to complete eval    OT Frequency 2x / week    OT Duration 2 weeks    OT Treatment/Interventions Self-care/ADL training;Contrast Bath;Fluidtherapy;Iontophoresis;Therapeutic exercise;Splinting;Patient/family education;Manual Therapy    Consulted and Agree with Plan of Care Patient           Patient will benefit from skilled therapeutic intervention in order to improve the following deficits and impairments:   Body Structure / Function / Physical Skills: ADL,IADL,Pain,Strength,Flexibility,ROM,UE functional use,Edema       Visit Diagnosis: Pain in left wrist  Radial styloid tenosynovitis  Muscle weakness (generalized)    Problem List Patient Active Problem List   Diagnosis Date Noted  . Breast cancer ([place]) 02/02/2017  . Environmental allergies 02/02/2017  . Menopausal syndrome 02/02/2017  . Chronic left hip pain 12/28/2016  . Left upper arm pain 12/28/2016  . Right foot pain 10/26/2016  . Greater trochanteric bursitis of left hip 08/03/2016  . Bilateral hand pain 04/27/2016  . Hyperlipemia   . Cognitive deficits   . Memory loss   . Lung nodule   . Malignant neoplasm of upper lobe of right lung ([place]) 05/27/2014    Rosalyn Gess  OTR/L,CLT 10/09/2020, 10:27 AM  [facility] [address] [place], [address] Phone: [phone]   Fax:  [phone]  Name: Margaret Potts MRN: [id number] Date of Birth: 12/18/1955

## 2020-10-14 ENCOUNTER — Other Ambulatory Visit: Payer: Self-pay

## 2020-10-14 ENCOUNTER — Ambulatory Visit: Payer: Federal, State, Local not specified - PPO | Attending: Sports Medicine | Admitting: Occupational Therapy

## 2020-10-14 DIAGNOSIS — M6281 Muscle weakness (generalized): Secondary | ICD-10-CM | POA: Insufficient documentation

## 2020-10-14 DIAGNOSIS — M654 Radial styloid tenosynovitis [de Quervain]: Secondary | ICD-10-CM | POA: Diagnosis present

## 2020-10-14 DIAGNOSIS — M25532 Pain in left wrist: Secondary | ICD-10-CM | POA: Insufficient documentation

## 2020-10-14 NOTE — Therapy (Signed)
[facility] [address], [place], [address] Phone: [phone]   Fax:  [phone]  Occupational Therapy Treatment  Patient Details  Name: Margaret Potts MRN: [id number] Date of Birth: 05-08-1956 Referring Provider (OT): Dr Candelaria Stagers   Encounter Date: 10/14/2020   OT End of Session - 10/14/20 1432    Visit Number 10    Number of Visits 10    Date for OT Re-Evaluation 10/16/20    OT Start Time 1352    OT Stop Time 1444    OT Time Calculation (min) 52 min    Activity Tolerance Patient tolerated treatment well    Behavior During Therapy [facility] for tasks assessed/performed           Past Medical History:  Diagnosis Date  . Arthritis   . Breast cancer ([place])    RT with mastectomy in 1995  . Cognitive deficits   . Depression   . Hyperlipemia   . Hypertension   . Lung cancer ([place])    RT upper lobe removed in OCT 2015  . Lung nodule   . Memory loss   . Seizures ([place])     Past Surgical History:  Procedure Laterality Date  . BREAST BIOPSY Left    benign  . BREAST BIOPSY Left 05/27/2017   [place] core ribbon REACTIVE FIBROSIS CONSISTENT WITH SCAR.   Marland Kitchen COLONOSCOPY WITH PROPOFOL N/A 02/09/2017   Procedure: COLONOSCOPY WITH PROPOFOL;  Surgeon: Jonathon Bellows, MD;  Location: [place];  Service: Gastroenterology;  Laterality: N/A;  . ESOPHAGOGASTRODUODENOSCOPY N/A 02/09/2017   Procedure: ESOPHAGOGASTRODUODENOSCOPY (EGD);  Surgeon: Jonathon Bellows, MD;  Location: [place];  Service: Gastroenterology;  Laterality: N/A;  . LUNG LOBECTOMY    . MASTECTOMY Right   . POLYPECTOMY N/A 02/09/2017   Procedure: POLYPECTOMY;  Surgeon: Jonathon Bellows, MD;  Location: [place];  Service: Gastroenterology;  Laterality: N/A;  . right mastectomy      There were no vitals filed for this visit.   Subjective Assessment - 10/14/20 1430    Subjective  Getting there - still getting better- trying to use my splints  but cannot push up yet or twisting all the way    Pertinent History Pt refer to OT because of L wrist pain  -seen Dr Candelaria Stagers 07/23/20- and refer to PT for L shoulder pain -  possible ECU tendinitis -was wearing on and off prefab wrist splint    Patient Stated Goals Want my wrist better so I can grip and twist objects, push up , driving and playing golf without pain    Currently in Pain? Yes    Pain Score 3    L ECU , R 1st dorsal compartment tenderness 1-2/10   Pain Location Wrist    Pain Orientation Left    Pain Descriptors / Indicators Aching;Tender    Pain Type Acute pain    Pain Onset More than a month ago    Pain Frequency Intermittent              [facility] OT Assessment - 10/14/20 0001      AROM   Left Wrist Extension 70 Degrees    Left Wrist Flexion 80 Degrees      Strength   Right Hand Grip (lbs) 42    Right Hand Lateral Pinch 10 lbs    Right Hand 3 Point Pinch 6 lbs    Left Hand Grip (lbs) 25    Left Hand Lateral  Pinch 10 lbs    Left Hand 3 Point Pinch 7 lbs           increase grip and prehension - see flowsheet And wrist AROM pain at end range 70 ext and flexion 80 degrees over ECU  As well as RD, UD of wrist     tenderness over ulnar wrist and ECU deceasing greatly - but still pull to about3-4/10   Tender around TFCC- ulnar wrist distal to ulnar styloid     reinforce again splint wearing  Tenderness  1st dorsal compartment 2-3/10; and And [name] 1/10  skin check done - no issues and pt kept patch on for hour afterwards                  OT Treatments/Exercises (OP) - 10/14/20 0001      Iontophoresis   Type of Iontophoresis Dexamethasone    Location ECU    Dose 1.8 current - med patch    Time 21      LUE Contrast Bath   Time 8 minutes    Comments prior to soft tissue            done soft tissue - webspace of hand and thumb and MC spreads graston tool nr 2 for sweeping volar and dorsal forearmprior to gentle  AAROM for wrist and thumb pain free Cont to wear custom thumb spica over neoprene thumb and wrist wrap to wear most all the time - off for ADL's and HEP Contrast at home - and then pain free AROM for wrist , thumb PA and RA -as well Opposition           OT Education - 10/14/20 1432    Education Details progress and splint wearing / HEP    Person(s) Educated Patient    Methods Explanation;Demonstration;Tactile cues;Verbal cues    Comprehension Returned demonstration;Verbalized understanding            OT Short Term Goals - 10/14/20 1435      OT SHORT TERM GOAL #1   Title Pt to be independent in HEP to wear splint , do HEP and modify using her hand to decrease pain    Baseline L pain at wrist 7-9/10 - 60% compliance with prefab wrist splint - NOW Pain decrease to 2-4/10    Status Achieved      OT SHORT TERM GOAL #2   Title Pain on PRWHE improve with more than 20 points    Baseline Eval pain on PRWHE 44/50 -10/10 - tenderness over L ulnar wrist, distal radius head 9/10 , positive Finkelstein , NOW pain still 30/50 and 2-5/10 pain at ECU more than 1st dorsal compartment    Time 1    Period Weeks    Status On-going    Target Date 10/16/20             OT Long Term Goals - 10/14/20 1438      OT LONG TERM GOAL #1   Title Pain decrease for pt to wean out of splint more than 75% of time    Baseline pain 9/10 and pt need to wear thumb spica all the time -except ADL's NOW pain decrease 2-4/10 but still need splint    Time 1    Period Weeks    Status On-going    Target Date 10/16/20      OT LONG TERM GOAL #2   Title R wrist and thumb AROM improve to WNL without increase symptoms  Baseline L wrist and thumb RA pain increase to 9/10, end range with pushing up , gripping and twist , end range flexion extention NOW pain better with thumb ROM , pain end range flexion, ext wrist , RD and UD end range and twisting supination - 2-5/10    Time 1    Period Weeks    Status  On-going    Target Date 10/16/20      OT LONG TERM GOAL #3   Title L grip and prehension strength increase with 10 and 2 lbs to without pain and increase symptoms    Baseline Grip R 35, L 15 lbs, lat and 3 point grip R 10 and L 6 lbs for both -pain limiting her  NOW grip increase to Grip 25, lat 10 , 3 point 7 lbs - but pain less than 2/10    Time 1    Period Weeks    Status On-going    Target Date 10/16/20                 Plan - 10/14/20 1433    Clinical Impression Statement Pt this date 9th session of ionto on L ECU - pain still about 1-2 /10 over 1st dorsal compartment tenderness -and ECU lateral and distal to ulnar styloid - around TFCC - 3-4/10 - pain with wrist ext and flexion  end range  - reinforce to wear splint if doing her shoulder stretches and most of the time should be on - thumb spica recommended because of some tenderness at distal radius    OT Occupational Profile and History Problem Focused Assessment - Including review of records relating to presenting problem    Occupational performance deficits (Please refer to evaluation for details): ADL's;IADL's;Work;Social Participation;Leisure;Play    Body Structure / Function / Physical Skills ADL;IADL;Pain;Strength;Flexibility;ROM;UE functional use;Edema    Rehab Potential Fair    Clinical Decision Making Limited treatment options, no task modification necessary    Comorbidities Affecting Occupational Performance: None    Modification or Assistance to Complete Evaluation  No modification of tasks or assist necessary to complete eval    OT Frequency 1x / week    OT Duration 2 weeks   1 wks   OT Treatment/Interventions Self-care/ADL training;Contrast Bath;Fluidtherapy;Iontophoresis;Therapeutic exercise;Splinting;Patient/family education;Manual Therapy    Consulted and Agree with Plan of Care Patient           Patient will benefit from skilled therapeutic intervention in order to improve the following deficits and  impairments:   Body Structure / Function / Physical Skills: ADL,IADL,Pain,Strength,Flexibility,ROM,UE functional use,Edema       Visit Diagnosis: Pain in left wrist  Radial styloid tenosynovitis  Muscle weakness (generalized)    Problem List Patient Active Problem List   Diagnosis Date Noted  . Breast cancer ([place]) 02/02/2017  . Environmental allergies 02/02/2017  . Menopausal syndrome 02/02/2017  . Chronic left hip pain 12/28/2016  . Left upper arm pain 12/28/2016  . Right foot pain 10/26/2016  . Greater trochanteric bursitis of left hip 08/03/2016  . Bilateral hand pain 04/27/2016  . Hyperlipemia   . Cognitive deficits   . Memory loss   . Lung nodule   . 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, [place], [address] Phone: [phone]   Fax:  [phone]  Name: Margaret Potts MRN: [id number] Date of Birth: [birth year]

## 2020-10-16 ENCOUNTER — Encounter: Payer: Federal, State, Local not specified - PPO | Admitting: Occupational Therapy

## 2020-10-21 ENCOUNTER — Encounter: Payer: Federal, State, Local not specified - PPO | Admitting: Occupational Therapy

## 2020-10-23 ENCOUNTER — Other Ambulatory Visit: Payer: Self-pay

## 2020-10-23 ENCOUNTER — Ambulatory Visit: Payer: Federal, State, Local not specified - PPO | Admitting: Occupational Therapy

## 2020-10-23 ENCOUNTER — Encounter: Payer: Federal, State, Local not specified - PPO | Admitting: Occupational Therapy

## 2020-10-23 DIAGNOSIS — M25532 Pain in left wrist: Secondary | ICD-10-CM

## 2020-10-23 DIAGNOSIS — M654 Radial styloid tenosynovitis [de Quervain]: Secondary | ICD-10-CM

## 2020-10-23 DIAGNOSIS — M6281 Muscle weakness (generalized): Secondary | ICD-10-CM

## 2020-10-23 NOTE — Therapy (Signed)
[facility] [address] [place], [address] Phone: [phone]   Fax:  [phone]  Occupational Therapy Treatment  Patient Details  Name: Margaret Potts MRN: [id number] Date of Birth: [birth year] Referring Provider (OT): Dr Candelaria Stagers   Encounter Date: 10/23/2020   OT End of Session - 10/23/20 0923    Visit Number 11    Number of Visits 11    Date for OT Re-Evaluation 10/23/20    OT Start Time 0902    OT Stop Time 0940    OT Time Calculation (min) 38 min    Activity Tolerance Patient tolerated treatment well    Behavior During Therapy [facility] for tasks assessed/performed           Past Medical History:  Diagnosis Date  . Arthritis   . Breast cancer ([place])    RT with mastectomy in 1995  . Cognitive deficits   . Depression   . Hyperlipemia   . Hypertension   . Lung cancer ([place])    RT upper lobe removed in OCT 2015  . Lung nodule   . Memory loss   . Seizures ([place])     Past Surgical History:  Procedure Laterality Date  . BREAST BIOPSY Left    benign  . BREAST BIOPSY Left 05/27/2017   [place] core ribbon REACTIVE FIBROSIS CONSISTENT WITH SCAR.   Marland Kitchen COLONOSCOPY WITH PROPOFOL N/A 02/09/2017   Procedure: COLONOSCOPY WITH PROPOFOL;  Surgeon: Jonathon Bellows, MD;  Location: [place];  Service: Gastroenterology;  Laterality: N/A;  . ESOPHAGOGASTRODUODENOSCOPY N/A 02/09/2017   Procedure: ESOPHAGOGASTRODUODENOSCOPY (EGD);  Surgeon: Jonathon Bellows, MD;  Location: [place];  Service: Gastroenterology;  Laterality: N/A;  . LUNG LOBECTOMY    . MASTECTOMY Right   . POLYPECTOMY N/A 02/09/2017   Procedure: POLYPECTOMY;  Surgeon: Jonathon Bellows, MD;  Location: [place];  Service: Gastroenterology;  Laterality: N/A;  . right mastectomy      There were no vitals filed for this visit.   Subjective Assessment - 10/23/20 0907    Subjective  Thumb is much better- okay now - but still on the side of wrist  cause me pain - worse with weight bearing and bending the wrist - goes up 4/10- it is much better than it was - but still there and my shoulder bothers me still    Pertinent History Pt refer to OT because of L wrist pain  -seen Dr Candelaria Stagers 07/23/20- and refer to PT for L shoulder pain -  possible ECU tendinitis -was wearing on and off prefab wrist splint    Patient Stated Goals Want my wrist better so I can grip and twist objects, push up , driving and playing golf without pain    Currently in Pain? Yes    Pain Score 4     Pain Location Wrist    Pain Orientation Left    Pain Descriptors / Indicators Tender    Pain Type Acute pain    Pain Onset More than a month ago    Pain Frequency Intermittent    Aggravating Factors  wrist ext and flexion - weight brearing              [facility] OT Assessment - 10/23/20 0001      AROM   Left Wrist Extension 70 Degrees   4/10 pain   Left Wrist Flexion 80 Degrees   4/10 pain   Left Wrist Radial Deviation 20 Degrees  Left Wrist Ulnar Deviation 25 Degrees      Strength   Right Hand Grip (lbs) 48    Right Hand Lateral Pinch 15 lbs    Right Hand 3 Point Pinch 10 lbs    Left Hand Grip (lbs) 31    Left Hand Lateral Pinch 8 lbs    Left Hand 3 Point Pinch 7 lbs            increase grip and prehension - see flowsheet And wrist AROM pain at end range 70 ext and flexion 80 degrees over ECU  As well as RD, UD of wrist    tenderness over ulnar wrist and ECU deceasing greatly - but still pullto about2-4/10 Tender around ulnar wrist distal to ulnar styloid   reinforce again splint wearing  Tenderness  1st dorsal compartment1/10; and And [name] 1/10  skin check done - no issues and pt kept patch on for hour afterwards - 10th session this date  Pt refer to MD for possible shot             OT Treatments/Exercises (OP) - 10/23/20 0001      Iontophoresis   Type of Iontophoresis Dexamethasone    Location ECU    Dose 1.8  current - med patch    Time 21                  OT Education - 10/23/20 0923    Education Details progress and splint wearing / HEP- and refer back to MD    Person(s) Educated Patient    Methods Explanation;Demonstration;Tactile cues;Verbal cues    Comprehension Returned demonstration;Verbalized understanding            OT Short Term Goals - 10/23/20 0924      OT SHORT TERM GOAL #1   Title Pt to be independent in HEP to wear splint , do HEP and modify using her hand to decrease pain    Baseline L pain at wrist 7-9/10 - 60% compliance with prefab wrist splint - NOW Pain decrease to 2-4/10    Status Achieved      OT SHORT TERM GOAL #2   Title Pain on PRWHE improve with more than 20 points    Baseline Eval pain on PRWHE 44/50 -10/10 - tenderness over L ulnar wrist, distal radius head 9/10 , positive Finkelstein , NOW pain still 30/50 and 2-5/10 pain at ECU more than 1st dorsal compartment    Status Partially Met             OT Long Term Goals - 10/23/20 0924      OT LONG TERM GOAL #1   Title Pain decrease for pt to wean out of splint more than 75% of time    Baseline pain 9/10 and pt need to wear thumb spica all the time -except ADL's NOW pain decrease 2-4/10 but still need splint    Status Partially Met      OT LONG TERM GOAL #2   Title R wrist and thumb AROM improve to WNL without increase symptoms    Baseline L wrist and thumb RA pain increase to 9/10, end range with pushing up , gripping and twist , end range flexion extention NOW pain better with thumb ROM , pain end range flexion, ext wrist , RD and UD end range and twisting supination - 2-5/10    Status Partially Met      OT LONG TERM GOAL #3   Title L grip  and prehension strength increase with 10 and 2 lbs to without pain and increase symptoms    Baseline Grip R 35, L 15 lbs, lat and 3 point grip R 10 and L 6 lbs for both -pain limiting her  NOW grip increase to Grip 32, lat 10 , 3 point 10 lbs - but pain  less than 2/10    Status Achieved                 Plan - 10/23/20 0925    Clinical Impression Statement Pt this date 10 th session of ionto on L ECU - pain still about 1-2 /10 over 1st dorsal compartment tenderness -and ECU lateral and distal to ulnar styloid  - 2-4/10 - pain with wrist ext (weightbearing) and flexion  end range  - reinforce to wear splint if doing her shoulder stretches and most of the time should be on - thumb spica recommended because of some tenderness at distal radius still -grip and prehension increased as well as range of motion at wrist before pain - but recommend for pt to follow up with Dr Candelaria Stagers for possible shot to ECU and ? 1st dorsal compartment- pt also report shoulder still bothering her- but she needs splint on wrist if doing weight or bands with shoulder rehab/HEP - cont with modifications at work    OT Occupational Profile and History Problem Focused Assessment - Including review of records relating to presenting problem    Occupational performance deficits (Please refer to evaluation for details): ADL's;IADL's;Work;Social Participation;Leisure;Play    Body Structure / Function / Physical Skills ADL;IADL;Pain;Strength;Flexibility;ROM;UE functional use;Edema    Rehab Potential Fair    Clinical Decision Making Limited treatment options, no task modification necessary    Comorbidities Affecting Occupational Performance: None    Modification or Assistance to Complete Evaluation  No modification of tasks or assist necessary to complete eval    OT Treatment/Interventions Self-care/ADL training;Contrast Bath;Fluidtherapy;Iontophoresis;Therapeutic exercise;Splinting;Patient/family education;Manual Therapy    Plan Refer to MD for possible shot to ECU and ? 1st dorsal compartment    Consulted and Agree with Plan of Care Patient           Patient will benefit from skilled therapeutic intervention in order to improve the following deficits and impairments:    Body Structure / Function / Physical Skills: ADL,IADL,Pain,Strength,Flexibility,ROM,UE functional use,Edema       Visit Diagnosis: Pain in left wrist  Radial styloid tenosynovitis  Muscle weakness (generalized)    Problem List Patient Active Problem List   Diagnosis Date Noted  . Breast cancer ([place]) 02/02/2017  . Environmental allergies 02/02/2017  . Menopausal syndrome 02/02/2017  . Chronic left hip pain 12/28/2016  . Left upper arm pain 12/28/2016  . Right foot pain 10/26/2016  . Greater trochanteric bursitis of left hip 08/03/2016  . Bilateral hand pain 04/27/2016  . Hyperlipemia   . Cognitive deficits   . Memory loss   . Lung nodule   . Malignant neoplasm of upper lobe of right lung ([place]) 05/27/2014    Gioia Ranes  OTR/l,CLT 10/23/2020, 9:30 AM  [facility] [address], [place], [address] Phone: [phone]   Fax:  [phone]  Name: Gerlean Cid MRN: [id number] Date of Birth: September 18, 1955

## 2021-01-08 ENCOUNTER — Telehealth (INDEPENDENT_AMBULATORY_CARE_PROVIDER_SITE_OTHER): Payer: Federal, State, Local not specified - PPO | Admitting: Gastroenterology

## 2021-01-08 DIAGNOSIS — Z86010 Personal history of colonic polyps: Secondary | ICD-10-CM

## 2021-01-08 MED ORDER — NA SULFATE-K SULFATE-MG SULF 17.5-3.13-1.6 GM/177ML PO SOLN: 1.0000 | Freq: Once | ORAL | 0 refills | Status: AC

## 2021-01-08 NOTE — Progress Notes (Signed)
Gastroenterology Pre-Procedure Review  Request Date: 01/20/2021  Requesting Physician: Dr. Vicente Males   PATIENT REVIEW QUESTIONS: The patient responded to the following health history questions as indicated:    1. Are you having any GI issues? no 2. Do you have a personal history of Polyps? yes (Colonoscopy 02/09/2017) 3. Do you have a family history of Colon Cancer or Polyps? yes (Distant relative on mother side but unsure if colon cancer or polyps ) 4. Diabetes Mellitus? no 5. Joint replacements in the past 12 months?no 6. Major health problems in the past 3 months?no 7. Any artificial heart valves, MVP, or defibrillator?no    MEDICATIONS & ALLERGIES:    Patient reports the following regarding taking any anticoagulation/antiplatelet therapy:   Plavix, Coumadin, Eliquis, Xarelto, Lovenox, Pradaxa, Brilinta, or Effient? no Aspirin? no  Patient confirms/reports the following medications:  Current Outpatient Medications  Medication Sig Dispense Refill   atorvastatin (LIPITOR) 10 MG tablet Take 10 mg by mouth daily.     bisoprolol-hydrochlorothiazide (ZIAC) 5-6.25 MG tablet Take by mouth.     Na Sulfate-K Sulfate-Mg Sulf 17.5-3.13-1.6 GM/177ML SOLN Take 1 kit by mouth once for 1 dose. 354 mL 0   Ascorbic Acid (VITAMIN C) 1000 MG tablet Take 1,000 mg by mouth daily. (Patient not taking: Reported on 01/08/2021)     benzonatate (TESSALON) 100 MG capsule  (Patient not taking: Reported on 01/08/2021)     bisoprolol (ZEBETA) 5 MG tablet Take 5 mg by mouth daily. (Patient not taking: Reported on 01/08/2021)     diclofenac Sodium (VOLTAREN) 1 % GEL Apply topically. (Patient not taking: Reported on 01/08/2021)     meloxicam (MOBIC) 15 MG tablet  (Patient not taking: Reported on 01/08/2021)     Misc Natural Products (BEE PROPOLIS PO) Take by mouth. (Patient not taking: Reported on 01/08/2021)     olopatadine (PATANOL) 0.1 % ophthalmic solution Apply to eye. (Patient not taking: Reported on 01/08/2021)      omeprazole (PRILOSEC) 20 MG capsule  (Patient not taking: Reported on 01/08/2021)     vitamin E 400 UNIT capsule Take by mouth. (Patient not taking: Reported on 01/08/2021)     No current facility-administered medications for this visit.    Patient confirms/reports the following allergies:  No Known Allergies  No orders of the defined types were placed in this encounter.   AUTHORIZATION INFORMATION Primary Insurance: 1D#: Group #:  Secondary Insurance: 1D#: Group #:  SCHEDULE INFORMATION: Date:  01/20/2021  Time: Location: [place]

## 2021-01-20 ENCOUNTER — Encounter
Admission: RE | Disposition: A | Discharge status: Home / Self Care | Payer: Self-pay | Source: Home / Self Care | Attending: Gastroenterology

## 2021-01-20 ENCOUNTER — Ambulatory Visit
Admission: RE | Admit: 2021-01-20 | Discharge: 2021-01-20 | Disposition: A | Discharge status: Home / Self Care | Payer: Federal, State, Local not specified - PPO | Attending: Gastroenterology | Admitting: Gastroenterology

## 2021-01-20 ENCOUNTER — Ambulatory Visit: Payer: Federal, State, Local not specified - PPO | Admitting: Anesthesiology

## 2021-01-20 ENCOUNTER — Encounter: Payer: Self-pay | Admitting: Gastroenterology

## 2021-01-20 DIAGNOSIS — Z853 Personal history of malignant neoplasm of breast: Secondary | ICD-10-CM | POA: Diagnosis not present

## 2021-01-20 DIAGNOSIS — Z86010 Personal history of colonic polyps: Secondary | ICD-10-CM | POA: Diagnosis not present

## 2021-01-20 DIAGNOSIS — Z79899 Other long term (current) drug therapy: Secondary | ICD-10-CM | POA: Diagnosis not present

## 2021-01-20 DIAGNOSIS — Z791 Long term (current) use of non-steroidal anti-inflammatories (NSAID): Secondary | ICD-10-CM | POA: Insufficient documentation

## 2021-01-20 DIAGNOSIS — Z09 Encounter for follow-up examination after completed treatment for conditions other than malignant neoplasm: Secondary | ICD-10-CM | POA: Diagnosis present

## 2021-01-20 DIAGNOSIS — Z85118 Personal history of other malignant neoplasm of bronchus and lung: Secondary | ICD-10-CM | POA: Insufficient documentation

## 2021-01-20 HISTORY — PX: COLONOSCOPY WITH PROPOFOL: SHX5780

## 2021-01-20 SURGERY — COLONOSCOPY WITH PROPOFOL
Anesthesia: General

## 2021-01-20 MED ORDER — LIDOCAINE HCL (CARDIAC) PF 100 MG/5ML IV SOSY
PREFILLED_SYRINGE | INTRAVENOUS | Status: DC | PRN
  Administered 2021-01-20: 40 mg via INTRAVENOUS

## 2021-01-20 MED ORDER — PROPOFOL 10 MG/ML IV BOLUS
INTRAVENOUS | Status: DC | PRN
  Administered 2021-01-20: 80 mg via INTRAVENOUS

## 2021-01-20 MED ORDER — PROPOFOL 500 MG/50ML IV EMUL
INTRAVENOUS | Status: AC
  Filled 2021-01-20: qty 50

## 2021-01-20 MED ORDER — SODIUM CHLORIDE 0.9 % IV SOLN
INTRAVENOUS | Status: DC
  Administered 2021-01-20: 1000 mL via INTRAVENOUS

## 2021-01-20 MED ORDER — PROPOFOL 500 MG/50ML IV EMUL
INTRAVENOUS | Status: DC | PRN
  Administered 2021-01-20: 150 ug/kg/min via INTRAVENOUS

## 2021-01-20 NOTE — Anesthesia Procedure Notes (Signed)
Date/Time: 01/20/2021 8:26 AM Performed by: Doreen Salvage, CRNA Pre-anesthesia Checklist: Patient identified, Emergency Drugs available, Suction available and Patient being monitored Patient Re-evaluated:Patient Re-evaluated prior to induction Oxygen Delivery Method: Nasal cannula Induction Type: IV induction Dental Injury: Teeth and Oropharynx as per pre-operative assessment  Comments: Nasal cannula with etCO2 monitoring

## 2021-01-20 NOTE — Transfer of Care (Signed)
Immediate Anesthesia Transfer of Care Note  Patient: Margaret Potts  Procedure(s) Performed: Procedure(s): COLONOSCOPY WITH PROPOFOL (N/A)  Patient Location: PACU and Endoscopy Unit  Anesthesia Type:General  Level of Consciousness: sedated  Airway & Oxygen Therapy: Patient Spontanous Breathing and Patient connected to nasal cannula oxygen  Post-op Assessment: Report given to RN and Post -op Vital signs reviewed and stable  Post vital signs: Reviewed and stable  Last Vitals:  Vitals:   01/20/21 0840 01/20/21 0847  BP: (!) 92/47 (!) 92/47  Pulse:  60  Resp: 14 14  Temp: 36.4 C (!) 36.4 C  SpO2: 94% 49%    Complications: No apparent anesthesia complications

## 2021-01-20 NOTE — Op Note (Signed)
[facility] Gastroenterology Patient Name: Margaret Potts Procedure Date: 01/20/2021 8:11 AM MRN: [id number] Account #: [id number] Date of Birth: 30-Mar-1956 Admit Type: Outpatient Age: 65 Room: [facility] ENDO ROOM 2 Gender: Female Note Status: Finalized Procedure:             Colonoscopy Indications:           Surveillance: Personal history of colonic polyps                         (unknown histology) on last colonoscopy more than 3                         years ago, Last colonoscopy: August 2018 Providers:             Jonathon Bellows MD, MD Medicines:             Monitored Anesthesia Care Complications:         No immediate complications. Procedure:             Pre-Anesthesia Assessment:                        - Prior to the procedure, a History and Physical was                         performed, and patient medications, allergies and                         sensitivities were reviewed. The patient's tolerance                         of previous anesthesia was reviewed.                        - The risks and benefits of the procedure and the                         sedation options and risks were discussed with the                         patient. All questions were answered and informed                         consent was obtained.                        - ASA Grade Assessment: II - A patient with mild                         systemic disease.                        After obtaining informed consent, the colonoscope was                         passed under direct vision. Throughout the procedure,                         the patient's blood pressure, pulse, and oxygen  saturations were monitored continuously. The                         Colonoscope was introduced through the anus and                         advanced to the the cecum, identified by the                         appendiceal orifice. The colonoscopy was performed                          with ease. The patient tolerated the procedure well.                         The quality of the bowel preparation was excellent. Findings:      The perianal and digital rectal examinations were normal.      The entire examined colon appeared normal on direct and retroflexion       views. Impression:            - The entire examined colon is normal on direct and                         retroflexion views.                        - No specimens collected. Recommendation:        - Discharge patient to home (with escort).                        - Resume previous diet.                        - Continue present medications.                        - Repeat colonoscopy in 10 years for screening                         purposes. Procedure Code(s):     --- Professional ---                        [phone], Colonoscopy, flexible; diagnostic, including                         collection of specimen(s) by brushing or washing, when                         performed (separate procedure) Diagnosis Code(s):     --- Professional ---                        Z86.010, Personal history of colonic polyps CPT copyright 2019 American Medical Association. All rights reserved. The codes documented in this report are preliminary and upon coder review may  be revised to meet current compliance requirements. Jonathon Bellows, MD Jonathon Bellows MD, MD 01/20/2021 8:43:51 AM This report has been signed electronically. Number of Addenda: 0 Note Initiated On: 01/20/2021 8:11 AM Scope Withdrawal Time: 0 hours 8 minutes 58 seconds  Total  Procedure Duration: 0 hours 13 minutes 51 seconds  Estimated Blood Loss:  Estimated blood loss: none.      [facility]

## 2021-01-20 NOTE — H&P (Signed)
Margaret Bellows, MD [address], [place], [place], [place], [address] [address], [place], [place], [place], [address] Phone: [phone]  Fax: [phone]  Primary Care Physician:  Idelle Crouch, MD   Pre-Procedure History & Physical: HPI:  Margaret Potts is a 65 y.o. female is here for an colonoscopy.   Past Medical History:  Diagnosis Date   Arthritis    Breast cancer ([place])    RT with mastectomy in 1995   Cognitive deficits    Depression    Hyperlipemia    Hypertension    Lung cancer ([place])    RT upper lobe removed in OCT 2015   Lung nodule    Memory loss    Seizures [facility])     Past Surgical History:  Procedure Laterality Date   BREAST BIOPSY Left    benign   BREAST BIOPSY Left 05/27/2017   [place] core ribbon REACTIVE FIBROSIS CONSISTENT WITH SCAR.    COLONOSCOPY WITH PROPOFOL N/A 02/09/2017   Procedure: COLONOSCOPY WITH PROPOFOL;  Surgeon: Margaret Bellows, MD;  Location: [place];  Service: Gastroenterology;  Laterality: N/A;   ESOPHAGOGASTRODUODENOSCOPY N/A 02/09/2017   Procedure: ESOPHAGOGASTRODUODENOSCOPY (EGD);  Surgeon: Margaret Bellows, MD;  Location: [place];  Service: Gastroenterology;  Laterality: N/A;   LUNG LOBECTOMY     MASTECTOMY Right    POLYPECTOMY N/A 02/09/2017   Procedure: POLYPECTOMY;  Surgeon: Margaret Bellows, MD;  Location: [place];  Service: Gastroenterology;  Laterality: N/A;   right mastectomy      Prior to Admission medications   Medication Sig Start Date End Date Taking? Authorizing Provider  atorvastatin (LIPITOR) 10 MG tablet Take 10 mg by mouth daily. 07/14/19  Yes [provider]  bisoprolol-hydrochlorothiazide [facility]) 5-6.25 MG tablet Take by mouth. 05/24/19 01/20/21 Yes [provider]  Ascorbic Acid (VITAMIN C) 1000 MG tablet Take 1,000 mg by mouth daily.    [provider]  benzonatate (TESSALON) 100 MG capsule  12/21/16   [provider]  bisoprolol (ZEBETA) 5 MG tablet Take 5  mg by mouth daily.    [provider]  diclofenac Sodium (VOLTAREN) 1 % GEL Apply topically. Patient not taking: Reported on 01/08/2021 02/02/19   [provider]  meloxicam (MOBIC) 15 MG tablet  03/05/19   [provider]  Misc Natural Products (BEE PROPOLIS PO) Take by mouth.    [provider]  olopatadine (PATANOL) 0.1 % ophthalmic solution Apply to eye. 09/16/16   [provider]  omeprazole (PRILOSEC) 20 MG capsule  01/27/17   [provider]  vitamin E 400 UNIT capsule Take by mouth.    [provider]    Allergies as of 01/08/2021   (No Known Allergies)    Family History  Problem Relation Age of Onset   Breast cancer Paternal Aunt    Liver cancer Mother    Lung cancer Sister    Liver cancer Brother    Hematuria Neg Hx    Renal cancer Neg Hx     Social History   Socioeconomic History   Marital status: Widowed    Spouse name: Not on file   Number of children: Not on file   Years of education: Not on file   Highest education level: Not on file  Occupational History   Not on file  Tobacco Use   Smoking status: Never   Smokeless tobacco: Never  Vaping Use   Vaping Use: Never used  Substance and Sexual Activity  Alcohol use: Yes    Comment: occassionally   Drug use: No   Sexual activity: Yes  Other Topics Concern   Not on file  Social History Narrative   Not on file   Social Determinants of Health   Financial Resource Strain: Not on file  Food Insecurity: Not on file  Transportation Needs: Not on file  Physical Activity: Not on file  Stress: Not on file  Social Connections: Not on file  Intimate Partner Violence: Not on file    Review of Systems: See HPI, otherwise negative ROS  Physical Exam: BP 129/74   Pulse 60   Temp (!) 97.4 F (36.3 C) (Temporal)   Resp 17   Ht 4' 11.06" (1.5 m)   Wt 54.4 kg   SpO2 97%   BMI 24.19 kg/m  General:   Alert,  pleasant and cooperative in NAD Head:   Normocephalic and atraumatic. Neck:  Supple; no masses or thyromegaly. Lungs:  Clear throughout to auscultation, normal respiratory effort.    Heart:  +S1, +S2, Regular rate and rhythm, No edema. Abdomen:  Soft, nontender and nondistended. Normal bowel sounds, without guarding, and without rebound.   Neurologic:  Alert and  oriented x4;  grossly normal neurologically.  Impression/Plan: Margaret Potts is here for an colonoscopy to be performed for surveillance due to prior history of colon polyps   Risks, benefits, limitations, and alternatives regarding  colonoscopy have been reviewed with the patient.  Questions have been answered.  All parties agreeable.   Margaret Bellows, MD  01/20/2021, 8:14 AM

## 2021-01-20 NOTE — Anesthesia Postprocedure Evaluation (Signed)
Anesthesia Post Note  Patient: Dentist  Procedure(s) Performed: COLONOSCOPY WITH PROPOFOL  Patient location during evaluation: Endoscopy Anesthesia Type: General Level of consciousness: awake and alert Pain management: pain level controlled Vital Signs Assessment: post-procedure vital signs reviewed and stable Respiratory status: spontaneous breathing, nonlabored ventilation, respiratory function stable and patient connected to nasal cannula oxygen Cardiovascular status: blood pressure returned to baseline and stable Postop Assessment: no apparent nausea or vomiting Anesthetic complications: no   No notable events documented.   Last Vitals:  Vitals:   01/20/21 0900 01/20/21 0920  BP: 117/74 136/71  Pulse: (!) 49 (!) 49  Resp: 11 12  Temp:    SpO2: 100% 100%    Last Pain:  Vitals:   01/20/21 0847  TempSrc: Temporal  PainSc:                  Precious Haws Elysabeth Aust

## 2021-01-20 NOTE — Anesthesia Preprocedure Evaluation (Signed)
Anesthesia Evaluation  Patient identified by MRN, date of birth, ID band Patient awake    Reviewed: Allergy & Precautions, NPO status , Patient's Chart, lab work & pertinent test results  History of Anesthesia Complications Negative for: history of anesthetic complications  Airway Mallampati: III  TM Distance: >3 FB Neck ROM: Full    Dental  (+) Chipped,    Pulmonary neg pulmonary ROS, neg shortness of breath,  Lung CA s/p resection   Pulmonary exam normal breath sounds clear to auscultation       Cardiovascular Exercise Tolerance: Good hypertension, Normal cardiovascular exam     Neuro/Psych Seizures -,  PSYCHIATRIC DISORDERS Depression negative psych ROS   GI/Hepatic Neg liver ROS, GERD  Medicated and Controlled,Rectal bleeding   Endo/Other  negative endocrine ROS  Renal/GU negative Renal ROS  negative genitourinary   Musculoskeletal  (+) Arthritis , Osteoarthritis,    Abdominal   Peds  Hematology negative hematology ROS (+) Breast CA s/p surgery with right LN dissection   Anesthesia Other Findings Past Medical History: No date: Arthritis No date: Breast cancer (HCC)     Comment:  RT with mastectomy in 1995 No date: Cognitive deficits No date: Depression No date: Hyperlipemia No date: Hypertension No date: Lung cancer ([place])     Comment:  RT upper lobe removed in OCT 2015 No date: Lung nodule No date: Memory loss No date: Seizures ([place])  Past Surgical History: No date: BREAST BIOPSY; Left     Comment:  benign 05/27/2017: BREAST BIOPSY; Left     Comment:  [place] core ribbon REACTIVE FIBROSIS CONSISTENT WITH SCAR.  02/09/2017: COLONOSCOPY WITH PROPOFOL; N/A     Comment:  Procedure: COLONOSCOPY WITH PROPOFOL;  Surgeon: Jonathon Bellows, MD;  Location: [place];  Service:               Gastroenterology;  Laterality: N/A; 02/09/2017: ESOPHAGOGASTRODUODENOSCOPY; N/A     Comment:  Procedure:  ESOPHAGOGASTRODUODENOSCOPY (EGD);  Surgeon:               Jonathon Bellows, MD;  Location: [place] [facility];                Service: Gastroenterology;  Laterality: N/A; No date: LUNG LOBECTOMY No date: MASTECTOMY; Right 02/09/2017: POLYPECTOMY; N/A     Comment:  Procedure: POLYPECTOMY;  Surgeon: Jonathon Bellows, MD;                Location: [place];  Service:               Gastroenterology;  Laterality: N/A; No date: right mastectomy  BMI    Body Mass Index: 24.19 kg/m      Reproductive/Obstetrics negative OB ROS                             Anesthesia Physical  Anesthesia Plan  ASA: 3  Anesthesia Plan: General   Post-op Pain Management:    Induction: Intravenous  PONV Risk Score and Plan: 2 and Propofol infusion and TIVA  Airway Management Planned: Natural Airway  Additional Equipment:   Intra-op Plan:   Post-operative Plan:   Informed Consent: I have reviewed the patients History and Physical, chart, labs and discussed the procedure including the risks, benefits and alternatives for the proposed anesthesia with the patient or authorized representative who has indicated his/her understanding and acceptance.  Dental Advisory Given  Plan Discussed with: CRNA  Anesthesia Plan Comments: (Patient consented for risks of anesthesia including but not limited to:  - adverse reactions to medications - risk of airway placement if required - damage to eyes, teeth, lips or other oral mucosa - nerve damage due to positioning  - sore throat or hoarseness - Damage to heart, brain, nerves, lungs, other parts of body or loss of life  Patient voiced understanding.)        Anesthesia Quick Evaluation

## 2021-01-21 ENCOUNTER — Encounter: Payer: Self-pay | Admitting: Gastroenterology

## 2021-05-27 ENCOUNTER — Other Ambulatory Visit: Payer: Self-pay | Admitting: Internal Medicine

## 2021-05-27 DIAGNOSIS — Z1231 Encounter for screening mammogram for malignant neoplasm of breast: Secondary | ICD-10-CM

## 2021-06-12 ENCOUNTER — Other Ambulatory Visit: Payer: Self-pay

## 2021-06-12 ENCOUNTER — Ambulatory Visit
Admission: RE | Admit: 2021-06-12 | Discharge: 2021-06-12 | Disposition: A | Discharge status: Home / Self Care | Payer: Federal, State, Local not specified - PPO | Source: Ambulatory Visit | Attending: Internal Medicine | Admitting: Internal Medicine

## 2021-06-12 DIAGNOSIS — Z1231 Encounter for screening mammogram for malignant neoplasm of breast: Secondary | ICD-10-CM | POA: Insufficient documentation

## 2022-03-12 IMAGING — MG DIGITAL SCREENING UNILAT LEFT W/ TOMO W/ CAD
6 series · 6 of 18 positions shown · non-contrast
Comparison: Previous exam(s).

CLINICAL DATA: Screening.

EXAM:
DIGITAL SCREENING UNILATERAL LEFT MAMMOGRAM WITH CAD AND
TOMOSYNTHESIS
TECHNIQUE: Left screening digital craniocaudal and mediolateral oblique
mammograms were obtained. Left screening digital breast
tomosynthesis was performed. The images were evaluated with
computer-aided detection.

[L CC synth-2D]
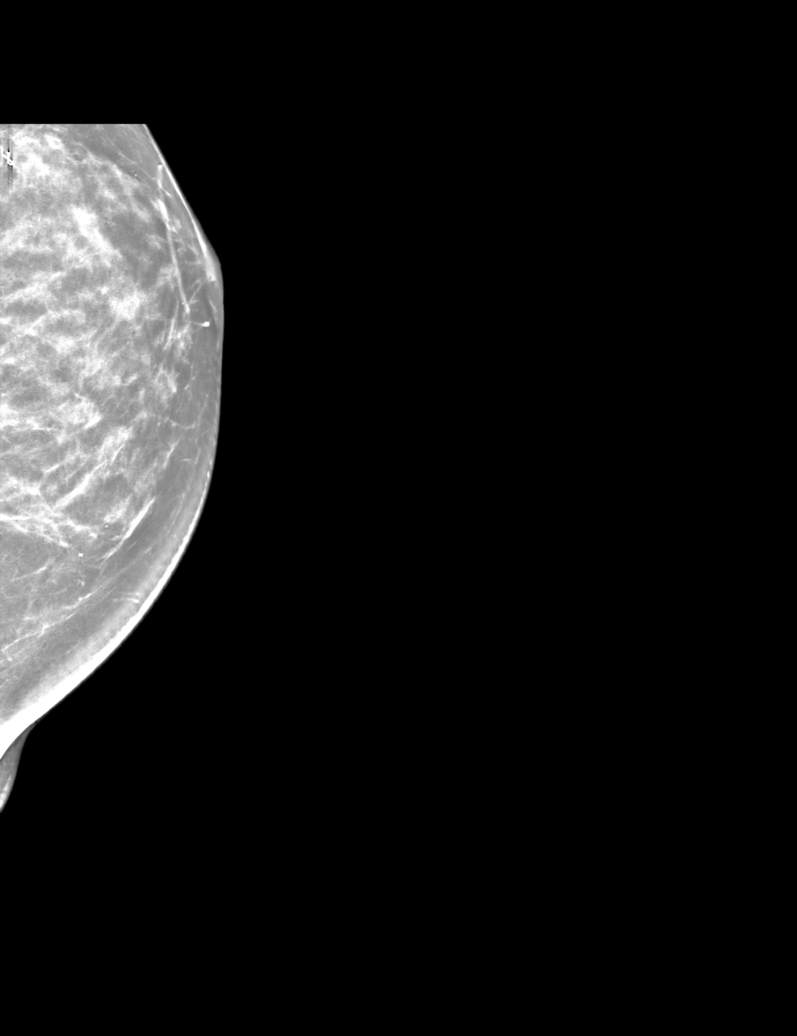

[L XCCL synth-2D]
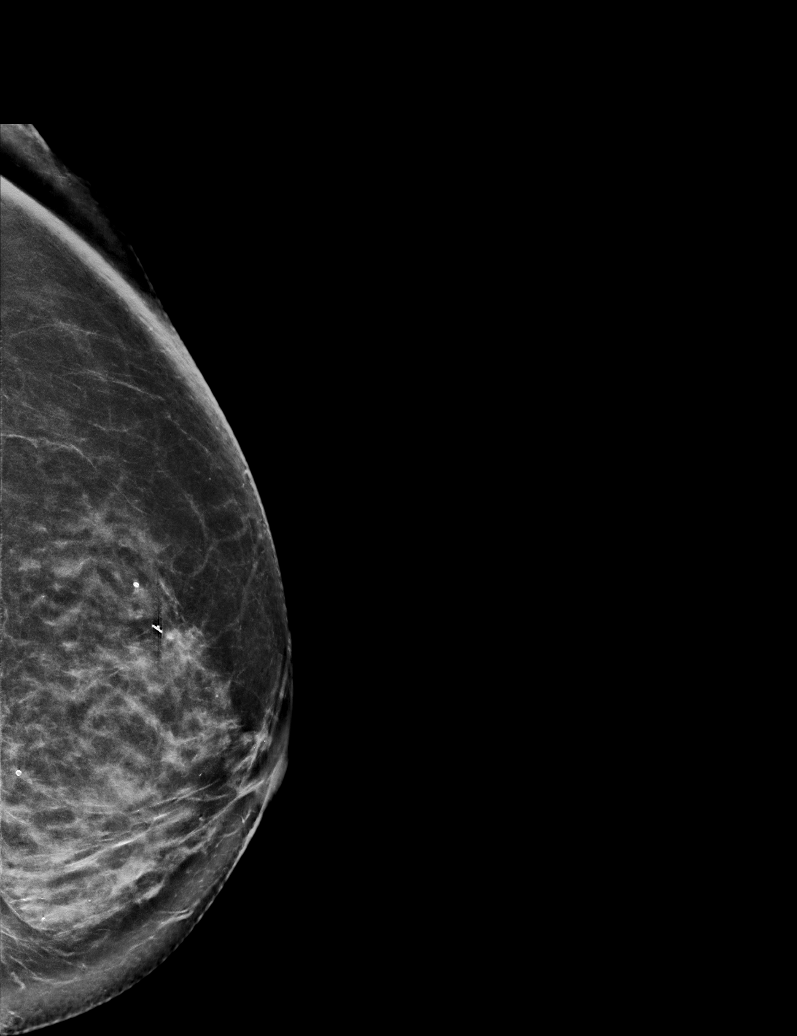

[L MLO synth-2D]
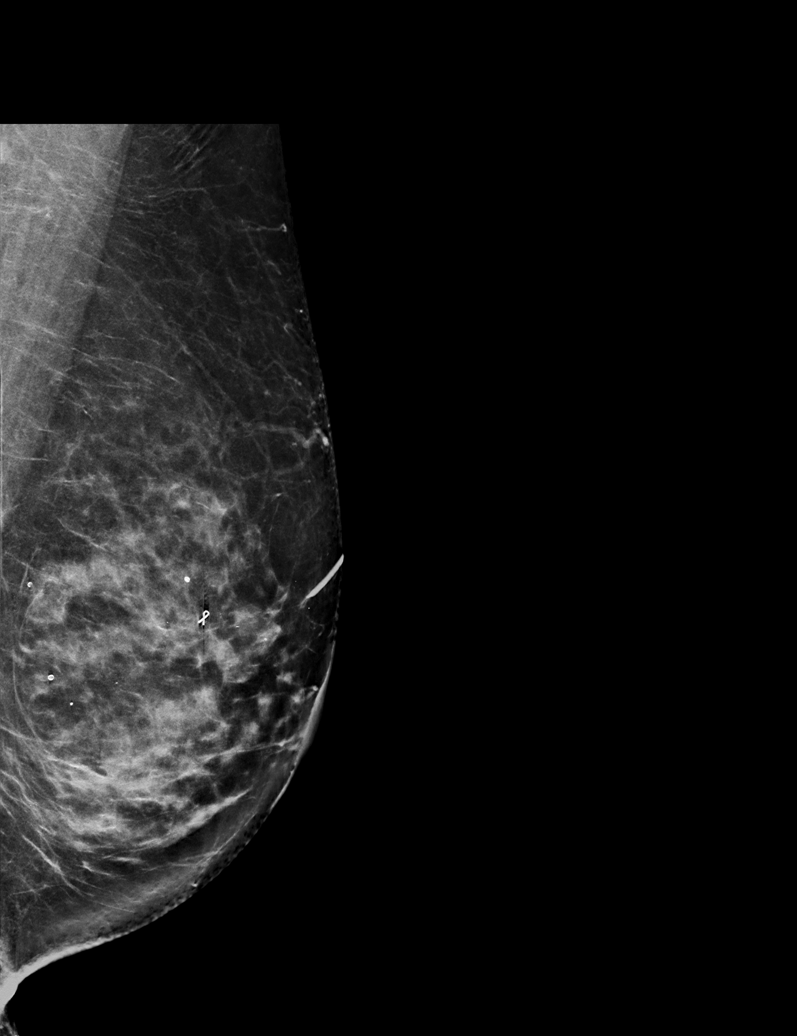

[L XCCL tomo · tomo slice 39/78.0]
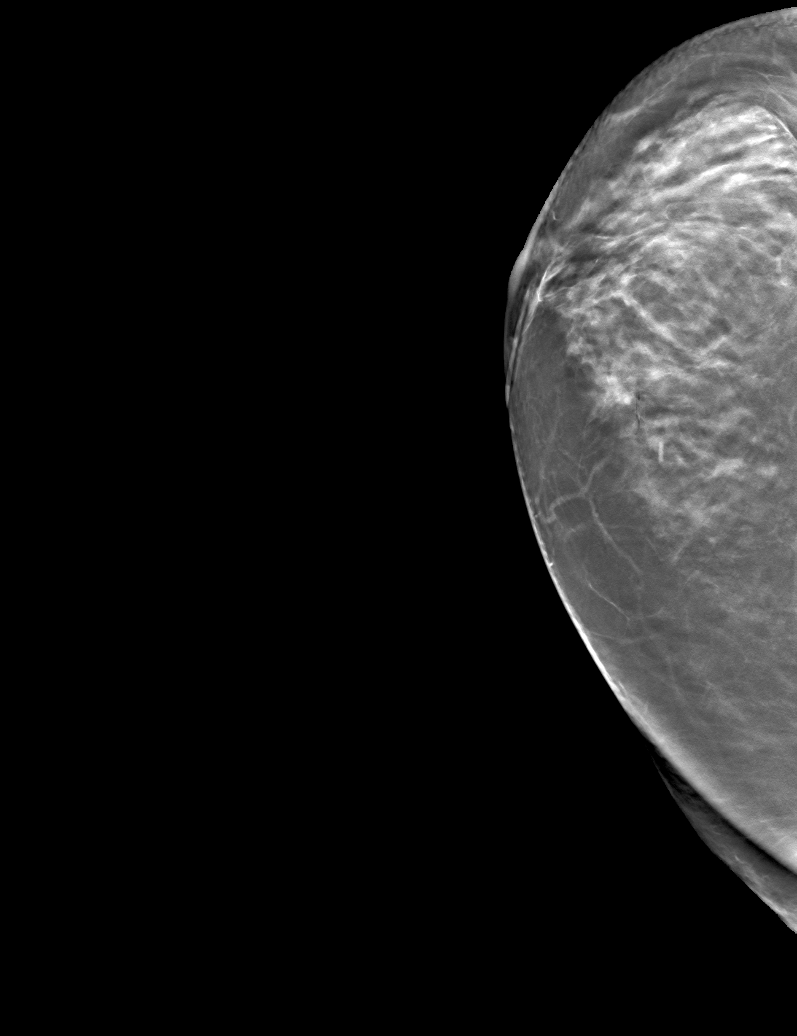

[L MLO tomo · tomo slice 37/74.0]
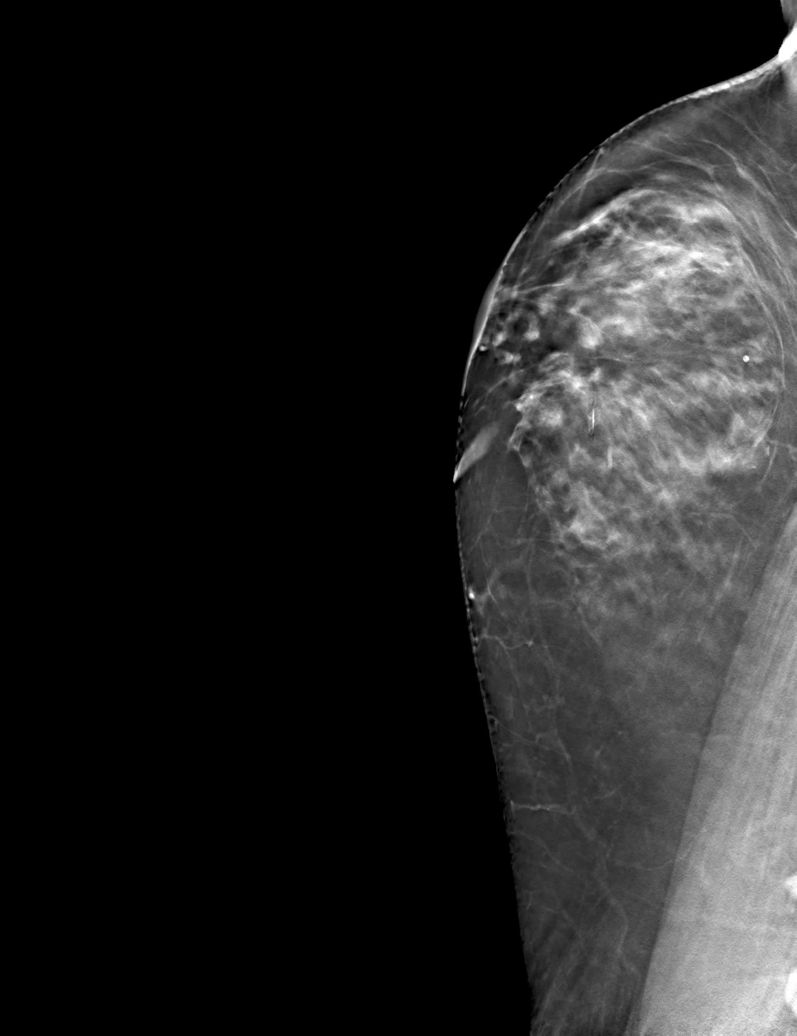

[L CC tomo · tomo slice 32/63.0]
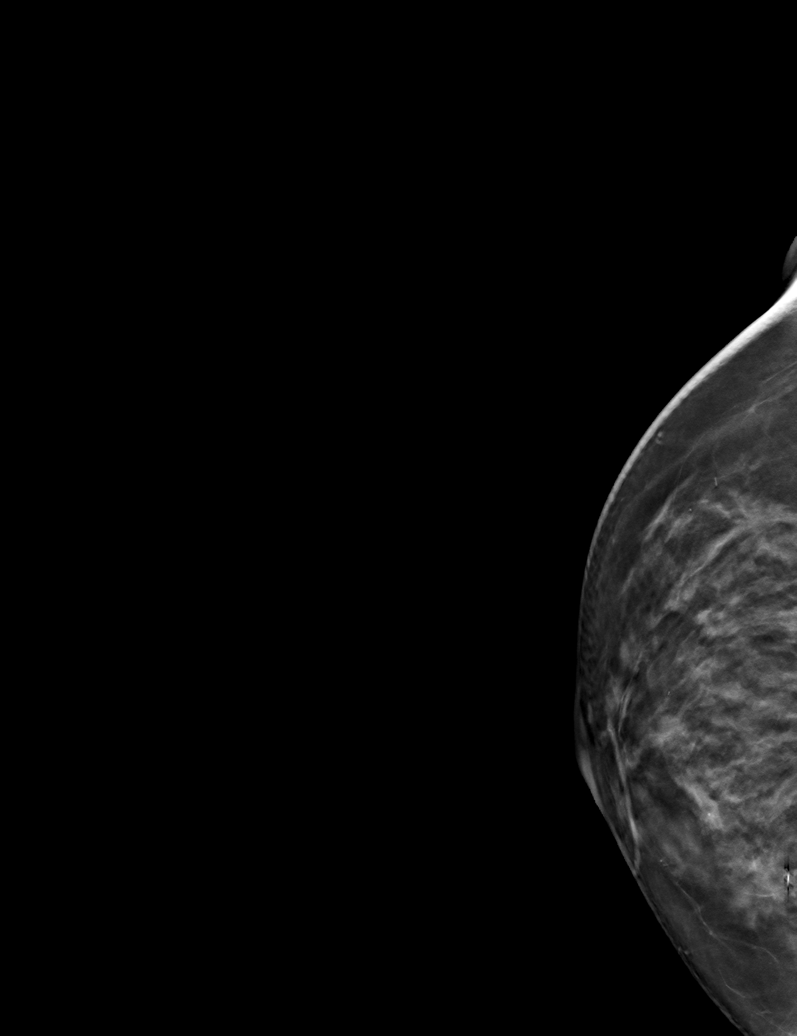

[6 of 18 positions shown; findings below may reference images not displayed]

ACR Breast Density Category d: The breast tissue is extremely dense,
which lowers the sensitivity of mammography.
FINDINGS: The patient has had a right mastectomy. There are no findings
suspicious for malignancy.
IMPRESSION: No mammographic evidence of malignancy. A result letter of this
screening mammogram will be mailed directly to the patient.

RECOMMENDATION:
Screening mammogram in one year.  (Code:[id number]-[id number])

BI-RADS CATEGORY  1: Negative.

## 2022-05-17 ENCOUNTER — Other Ambulatory Visit: Payer: Self-pay | Admitting: Internal Medicine

## 2022-05-17 DIAGNOSIS — Z1231 Encounter for screening mammogram for malignant neoplasm of breast: Secondary | ICD-10-CM

## 2022-06-16 ENCOUNTER — Other Ambulatory Visit: Payer: Self-pay | Admitting: Internal Medicine

## 2022-06-16 ENCOUNTER — Ambulatory Visit
Admission: RE | Admit: 2022-06-16 | Discharge: 2022-06-16 | Disposition: A | Discharge status: Home / Self Care | Payer: Federal, State, Local not specified - PPO | Source: Ambulatory Visit | Attending: Internal Medicine | Admitting: Internal Medicine

## 2022-06-16 DIAGNOSIS — Z1231 Encounter for screening mammogram for malignant neoplasm of breast: Secondary | ICD-10-CM | POA: Diagnosis present

## 2022-08-18 ENCOUNTER — Other Ambulatory Visit: Payer: Self-pay | Admitting: Orthopedic Surgery

## 2022-08-18 DIAGNOSIS — M25462 Effusion, left knee: Secondary | ICD-10-CM

## 2022-08-18 DIAGNOSIS — M25562 Pain in left knee: Secondary | ICD-10-CM

## 2022-08-18 DIAGNOSIS — G8929 Other chronic pain: Secondary | ICD-10-CM

## 2022-08-23 ENCOUNTER — Ambulatory Visit
Admission: RE | Admit: 2022-08-23 | Discharge: 2022-08-23 | Disposition: A | Discharge status: Home / Self Care | Payer: Federal, State, Local not specified - PPO | Source: Ambulatory Visit | Attending: Orthopedic Surgery | Admitting: Orthopedic Surgery

## 2022-08-23 DIAGNOSIS — M25562 Pain in left knee: Secondary | ICD-10-CM

## 2022-08-23 DIAGNOSIS — M25462 Effusion, left knee: Secondary | ICD-10-CM

## 2022-08-23 DIAGNOSIS — G8929 Other chronic pain: Secondary | ICD-10-CM

## 2023-02-04 ENCOUNTER — Other Ambulatory Visit: Payer: Self-pay | Admitting: Internal Medicine

## 2023-02-04 DIAGNOSIS — Z1231 Encounter for screening mammogram for malignant neoplasm of breast: Secondary | ICD-10-CM

## 2023-06-20 ENCOUNTER — Ambulatory Visit
Admission: RE | Admit: 2023-06-20 | Discharge: 2023-06-20 | Disposition: A | Discharge status: Home / Self Care | Payer: Medicare Other | Source: Ambulatory Visit | Attending: Internal Medicine | Admitting: Internal Medicine

## 2023-06-20 DIAGNOSIS — Z1231 Encounter for screening mammogram for malignant neoplasm of breast: Secondary | ICD-10-CM | POA: Diagnosis present

## 2024-02-24 ENCOUNTER — Other Ambulatory Visit: Payer: Self-pay | Admitting: Internal Medicine

## 2024-02-24 DIAGNOSIS — N644 Mastodynia: Secondary | ICD-10-CM

## 2024-02-27 ENCOUNTER — Ambulatory Visit
Admission: RE | Admit: 2024-02-27 | Discharge: 2024-02-27 | Disposition: A | Discharge status: Home / Self Care | Source: Ambulatory Visit | Attending: Internal Medicine | Admitting: Internal Medicine

## 2024-02-27 DIAGNOSIS — N644 Mastodynia: Secondary | ICD-10-CM | POA: Diagnosis present
